# Patient Record
Sex: Female | Born: 1975 | Race: Black or African American | Hispanic: No | Marital: Married | Smoking: Never smoker
Health system: Southern US, Community
[De-identification: ages and names within clinical notes are randomized; demographics above are authoritative.]

## PROBLEM LIST (undated history)

## (undated) DIAGNOSIS — O209 Hemorrhage in early pregnancy, unspecified: Secondary | ICD-10-CM

## (undated) DIAGNOSIS — Z789 Other specified health status: Secondary | ICD-10-CM

## (undated) DIAGNOSIS — D649 Anemia, unspecified: Secondary | ICD-10-CM

## (undated) DIAGNOSIS — O3680X0 Pregnancy with inconclusive fetal viability, not applicable or unspecified: Secondary | ICD-10-CM

## (undated) HISTORY — PX: NO PAST SURGERIES: SHX2092

## (undated) HISTORY — DX: Anemia, unspecified: D64.9

---

## 2016-07-05 ENCOUNTER — Inpatient Hospital Stay (HOSPITAL_COMMUNITY)
Admission: AD | Admit: 2016-07-05 | Discharge: 2016-07-05 | Disposition: A | Discharge status: Home / Self Care | Payer: Medicaid Other | Source: Ambulatory Visit | Attending: Family Medicine | Admitting: Family Medicine

## 2016-07-05 ENCOUNTER — Inpatient Hospital Stay (HOSPITAL_COMMUNITY): Payer: Medicaid Other

## 2016-07-05 ENCOUNTER — Encounter (HOSPITAL_COMMUNITY): Payer: Self-pay | Admitting: *Deleted

## 2016-07-05 DIAGNOSIS — N939 Abnormal uterine and vaginal bleeding, unspecified: Secondary | ICD-10-CM | POA: Diagnosis present

## 2016-07-05 DIAGNOSIS — O208 Other hemorrhage in early pregnancy: Secondary | ICD-10-CM

## 2016-07-05 DIAGNOSIS — O209 Hemorrhage in early pregnancy, unspecified: Secondary | ICD-10-CM | POA: Diagnosis not present

## 2016-07-05 DIAGNOSIS — O3680X0 Pregnancy with inconclusive fetal viability, not applicable or unspecified: Secondary | ICD-10-CM

## 2016-07-05 DIAGNOSIS — O09511 Supervision of elderly primigravida, first trimester: Secondary | ICD-10-CM | POA: Insufficient documentation

## 2016-07-05 DIAGNOSIS — Z3A01 Less than 8 weeks gestation of pregnancy: Secondary | ICD-10-CM | POA: Diagnosis not present

## 2016-07-05 HISTORY — DX: Pregnancy with inconclusive fetal viability, not applicable or unspecified: O36.80X0

## 2016-07-05 HISTORY — DX: Hemorrhage in early pregnancy, unspecified: O20.9

## 2016-07-05 HISTORY — DX: Other specified health status: Z78.9

## 2016-07-05 LAB — WET PREP, GENITAL
CLUE CELLS WET PREP: NONE SEEN
Sperm: NONE SEEN
Trich, Wet Prep: NONE SEEN
YEAST WET PREP: NONE SEEN

## 2016-07-05 LAB — URINALYSIS, ROUTINE W REFLEX MICROSCOPIC
Bilirubin Urine: NEGATIVE
Glucose, UA: NEGATIVE mg/dL
Ketones, ur: NEGATIVE mg/dL
NITRITE: NEGATIVE
Protein, ur: NEGATIVE mg/dL
SPECIFIC GRAVITY, URINE: 1.019 (ref 1.005–1.030)
pH: 6 (ref 5.0–8.0)

## 2016-07-05 LAB — CBC
HCT: 38.2 % (ref 36.0–46.0)
HEMOGLOBIN: 13.5 g/dL (ref 12.0–15.0)
MCH: 31.5 pg (ref 26.0–34.0)
MCHC: 35.3 g/dL (ref 30.0–36.0)
MCV: 89.3 fL (ref 78.0–100.0)
Platelets: 301 10*3/uL (ref 150–400)
RBC: 4.28 MIL/uL (ref 3.87–5.11)
RDW: 13.4 % (ref 11.5–15.5)
WBC: 8.5 10*3/uL (ref 4.0–10.5)

## 2016-07-05 LAB — ABO/RH: ABO/RH(D): A POS

## 2016-07-05 LAB — POCT PREGNANCY, URINE: PREG TEST UR: POSITIVE — AB

## 2016-07-05 LAB — HCG, QUANTITATIVE, PREGNANCY: hCG, Beta Chain, Quant, S: 18 m[IU]/mL — ABNORMAL HIGH (ref <5)

## 2016-07-05 NOTE — MAU Note (Signed)
+  vaginal bleeding x 3 days; patient states has to wear a pad; light spotting light pink to brown in color  Denies any pain at this time  +HPT LMP 05/17/16

## 2016-07-05 NOTE — MAU Provider Note (Signed)
History     CSN: [id number]  Arrival date and time: 07/05/16 1112   First Provider Initiated Contact with Patient 07/05/16 1319      Chief Complaint  Patient presents with  . Vaginal Bleeding  . Possible Pregnancy   [name] is a 41 yo G1P0 at 5.[redacted] wks gestation by LMP presenting today with complaints of light vaginal spotting that was pink to brown and gradually increased to "much more" over the past 3 days.  She states the bleeding is "okay today"; reporting there is no bleeding today. She denies pain.    Vaginal Bleeding  The patient's primary symptoms include vaginal bleeding (began with spotting, states increased bleeding over the past 3 days). The problem has been gradually worsening. The patient is experiencing no pain. She is pregnant.  Possible Pregnancy  This is a new (positive home pregnacy test) problem. The current episode started in the past 7 days.     Past Medical History:  Diagnosis Date  . Medical history non-contributory     Past Surgical History:  Procedure Laterality Date  . NO PAST SURGERIES      History reviewed. No pertinent family history.  Social History  Substance Use Topics  . Smoking status: Never Smoker  . Smokeless tobacco: Never Used  . Alcohol use No    Allergies: No Known Allergies  Prescriptions Prior to Admission  Medication Sig Dispense Refill Last Dose  . Prenatal Vit-Fe Fumarate-FA (PRENATAL MULTIVITAMIN) TABS tablet Take 1 tablet by mouth daily at 12 noon.   07/05/2016 at Unknown time    Review of Systems  Genitourinary: Positive for vaginal bleeding.   Physical Exam   Blood pressure 136/79, pulse 76, temperature 97.4 F (36.3 C), temperature source Oral, resp. rate 16, weight 78.9 kg (174 lb), last menstrual period 05/17/2016, SpO2 99 %.  Physical Exam  Constitutional: She appears well-developed and well-nourished.  Cardiovascular: Normal rate, regular rhythm and normal heart sounds.   Respiratory: Effort  normal and breath sounds normal.  GI: Soft. Bowel sounds are normal.  Genitourinary: Cervix exhibits no motion tenderness and no friability. Right adnexum displays no mass and no tenderness. Left adnexum displays no mass and no tenderness. There is bleeding in the vagina.  Genitourinary Comments: Vaginal bleeding   Skin: Skin is warm and dry.   Results for orders placed or performed during the hospital encounter of 07/05/16 (from the past 24 hour(s))  Urinalysis, Routine w reflex microscopic     Status: Abnormal   Collection Time: 07/05/16 11:37 AM  Result Value Ref Range   Color, Urine YELLOW YELLOW   APPearance HAZY (A) CLEAR   Specific Gravity, Urine 1.019 1.005 - 1.030   pH 6.0 5.0 - 8.0   Glucose, UA NEGATIVE NEGATIVE mg/dL   Hgb urine dipstick LARGE (A) NEGATIVE   Bilirubin Urine NEGATIVE NEGATIVE   Ketones, ur NEGATIVE NEGATIVE mg/dL   Protein, ur NEGATIVE NEGATIVE mg/dL   Nitrite NEGATIVE NEGATIVE   Leukocytes, UA LARGE (A) NEGATIVE   RBC / HPF TOO NUMEROUS TO COUNT 0 - 5 RBC/hpf   WBC, UA TOO NUMEROUS TO COUNT 0 - 5 WBC/hpf   Bacteria, UA RARE (A) NONE SEEN   Squamous Epithelial / LPF 6-30 (A) NONE SEEN   Mucous PRESENT   Pregnancy, urine POC     Status: Abnormal   Collection Time: 07/05/16 11:48 AM  Result Value Ref Range   Preg Test, Ur POSITIVE (A) NEGATIVE  Wet prep, genital  Status: Abnormal   Collection Time: 07/05/16  1:25 PM  Result Value Ref Range   Yeast Wet Prep HPF POC NONE SEEN NONE SEEN   Trich, Wet Prep NONE SEEN NONE SEEN   Clue Cells Wet Prep HPF POC NONE SEEN NONE SEEN   WBC, Wet Prep HPF POC MANY (A) NONE SEEN   Sperm NONE SEEN   CBC     Status: None   Collection Time: 07/05/16  1:33 PM  Result Value Ref Range   WBC 8.5 4.0 - 10.5 K/uL   RBC 4.28 3.87 - 5.11 MIL/uL   Hemoglobin 13.5 12.0 - 15.0 g/dL   HCT 38.2 36.0 - 46.0 %   MCV 89.3 78.0 - 100.0 fL   MCH 31.5 26.0 - 34.0 pg   MCHC 35.3 30.0 - 36.0 g/dL   RDW 13.4 11.5 - 15.5 %    Platelets 301 150 - 400 K/uL  hCG, quantitative, pregnancy     Status: Abnormal   Collection Time: 07/05/16  1:33 PM  Result Value Ref Range   hCG, Beta Chain, Quant, S 18 (H) <5 mIU/mL  ABO/Rh     Status: None (Preliminary result)   Collection Time: 07/05/16  1:33 PM  Result Value Ref Range   ABO/RH(D) A POS    MAU Course  Procedures  MDM CBC OB U/S <14 wks  OB U/S Transvaginal  HCG ABO/Rh GC/CT Wet Prep HIV    Assessment and Plan  Vaginal bleeding in pregnancy, first trimester Instructed her to return to clinic for lab work on Thursday at 11 am and to return to MAU if vaginal bleeding returns.  Pregnancy of unknown anatomic location Instructed her to return to the clinic for lab work on Thursday at 64 am and educated her on the importance of being on time.  Explained to her to return to MAU if vaginal bleeding returns, if she sees any vaginal discharge, or if she begins to experience any abdominal pain.    Pasty Spillers, PA-S 07/05/2016 1:34 PM  I confirm that I have verified the information documented in the physician assistant student's note and that I have also personally reperformed the physical exam and all medical decision making activities.  Laury Deep MSN, CNM 07/05/2016, 1:34 PM

## 2016-07-06 LAB — HIV ANTIBODY (ROUTINE TESTING W REFLEX): HIV Screen 4th Generation wRfx: NONREACTIVE

## 2016-07-06 LAB — GC/CHLAMYDIA PROBE AMP (~~LOC~~) NOT AT ARMC
CHLAMYDIA, DNA PROBE: NEGATIVE
Neisseria Gonorrhea: NEGATIVE

## 2016-07-07 ENCOUNTER — Ambulatory Visit: Payer: Self-pay | Admitting: General Practice

## 2016-07-07 DIAGNOSIS — O3680X0 Pregnancy with inconclusive fetal viability, not applicable or unspecified: Secondary | ICD-10-CM

## 2016-07-07 LAB — HCG, QUANTITATIVE, PREGNANCY: HCG, BETA CHAIN, QUANT, S: 11 m[IU]/mL — AB (ref <5)

## 2016-07-07 NOTE — Progress Notes (Signed)
Patient here for stat bhcg today. Patient denies bleeding or pain but would like to wait for results. Spoke to Dr Baron Sane regarding bhcg results who states levels are consistent with SAB. Patient should follow up in 1 week for repeat bhcg & follow up in 2 weeks with a provider. Informed patient of results & recommended follow up.  Patient verbalized understanding & had no questions

## 2016-07-14 ENCOUNTER — Other Ambulatory Visit: Payer: Self-pay

## 2016-07-14 DIAGNOSIS — O3680X0 Pregnancy with inconclusive fetal viability, not applicable or unspecified: Secondary | ICD-10-CM

## 2016-07-15 ENCOUNTER — Telehealth: Payer: Self-pay | Admitting: General Practice

## 2016-07-15 LAB — BETA HCG QUANT (REF LAB): hCG Quant: 6 m[IU]/mL

## 2016-07-15 NOTE — Telephone Encounter (Signed)
Called patient, no answer- left message stating we are trying to reach you to inform you your recent lab work was normal. If you have any questions you may call us back.

## 2016-07-15 NOTE — Telephone Encounter (Signed)
-----   Message from Truett Mainland, DO sent at 07/15/2016  9:08 AM EDT ----- No further quants needed.

## 2016-07-18 ENCOUNTER — Ambulatory Visit (INDEPENDENT_AMBULATORY_CARE_PROVIDER_SITE_OTHER): Payer: BLUE CROSS/BLUE SHIELD | Admitting: Advanced Practice Midwife

## 2016-07-18 ENCOUNTER — Encounter: Payer: Self-pay | Admitting: Advanced Practice Midwife

## 2016-07-18 VITALS — BP 132/85 | HR 108 | Wt 179.0 lb

## 2016-07-18 DIAGNOSIS — O039 Complete or unspecified spontaneous abortion without complication: Secondary | ICD-10-CM | POA: Diagnosis not present

## 2016-07-18 NOTE — Progress Notes (Signed)
Subjective:     Patient ID: Martha Valencia, female   DOB: [birth year], 41 y.o.   MRN: [id number]  HPI: Here for F/U after SAB. Was seen in MAU. Bled for two days. No pain of bleeding now. Husband in still in [place]. Did not get Visa. Pt declines birth control.  Quants: 5/29: 18 5/31: 11  6/7: 6  Last Pap in [place] 6 months ago Nml per pt.   Review of Systems  Constitutional: Negative for chills and fever.  Gastrointestinal: Negative for abdominal pain.  Genitourinary: Negative for vaginal bleeding and vaginal discharge.  Neurological: Negative for dizziness.  Psychiatric/Behavioral: Negative for dysphoric mood.       Objective:BP 132/85   Pulse (!) 108   Wt 179 lb (81.2 kg)     Physical Exam  Constitutional: She is oriented to person, place, and time. She appears well-developed and well-nourished. No distress.  Cardiovascular: Normal rate.   Pulmonary/Chest: Effort normal. No respiratory distress.  Abdominal: There is no tenderness.  Genitourinary:  Genitourinary Comments: declined  Neurological: She is alert and oriented to person, place, and time.  Skin: Skin is warm and dry.  Psychiatric: She has a normal mood and affect.  Nursing note and vitals reviewed.      Assessment:     1. Complete miscarriage    Plan:     F/U PRN Support given.  Offered visit w/ Roselyn Reef, declines.  Recommend Annual Gyn visits Pap due [place], [place], [place] 07/18/2016 3:03 PM

## 2016-07-18 NOTE — Patient Instructions (Signed)
Preparing for Pregnancy If you are considering becoming pregnant, make an appointment to see your regular health care provider to learn how to prepare for a safe and healthy pregnancy (preconception care). During a preconception care visit, your health care provider will:  Do a complete physical exam, including a Pap test.  Take a complete medical history.  Give you information, answer your questions, and help you resolve problems.  Preconception checklist Medical history  Tell your health care provider about any current or past medical conditions. Your pregnancy or your ability to become pregnant may be affected by chronic conditions, such as diabetes, chronic hypertension, and thyroid problems.  Include your family's medical history as well as your partner's medical history.  Tell your health care provider about any history of STIs (sexually transmitted infections).These can affect your pregnancy. In some cases, they can be passed to your baby. Discuss any concerns that you have about STIs.  If indicated, discuss the benefits of genetic testing. This testing will show whether there are any genetic conditions that may be passed from you or your partner to your baby.  Tell your health care provider about: ? Any problems you have had with conception or pregnancy. ? Any medicines you take. These include vitamins, herbal supplements, and over-the-counter medicines. ? Your history of immunizations. Discuss any vaccinations that you may need.  Diet  Ask your health care provider what to include in a healthy diet that has a balance of nutrients. This is especially important when you are pregnant or preparing to become pregnant.  Ask your health care provider to help you reach a healthy weight before pregnancy. ? If you are overweight, you may be at higher risk for certain complications, such as high blood pressure, diabetes, and preterm birth. ? If you are underweight, you are more likely  to have a baby who has a low birth weight.  Lifestyle, work, and home  Let your health care provider know: ? About any lifestyle habits that you have, such as alcohol use, drug use, or smoking. ? About recreational activities that may put you at risk during pregnancy, such as downhill skiing and certain exercise programs. ? Tell your health care provider about any international travel, especially any travel to places with an active Zika virus outbreak. ? About harmful substances that you may be exposed to at work or at home. These include chemicals, pesticides, radiation, or even litter boxes. ? If you do not feel safe at home.  Mental health  Tell your health care provider about: ? Any history of mental health conditions, including feelings of depression, sadness, or anxiety. ? Any medicines that you take for a mental health condition. These include herbs and supplements.  Home instructions to prepare for pregnancy Lifestyle  Eat a balanced diet. This includes fresh fruits and vegetables, whole grains, lean meats, low-fat dairy products, healthy fats, and foods that are high in fiber. Ask to meet with a nutritionist or registered dietitian for assistance with meal planning and goals.  Get regular exercise. Try to be active for at least 30 minutes a day on most days of the week. Ask your health care provider which activities are safe during pregnancy.  Do not use any products that contain nicotine or tobacco, such as cigarettes and e-cigarettes. If you need help quitting, ask your health care provider.  Do not drink alcohol.  Do not take illegal drugs.  Maintain a healthy weight. Ask your health care provider what weight range is   right for you.  General instructions  Keep an accurate record of your menstrual periods. This makes it easier for your health care provider to determine your baby's due date.  Begin taking prenatal vitamins and folic acid supplements daily as directed by  your health care provider.  Manage any chronic conditions, such as high blood pressure and diabetes, as told by your health care provider. This is important.  How do I know that I am pregnant? You may be pregnant if you have been sexually active and you miss your period. Symptoms of early pregnancy include:  Mild cramping.  Very light vaginal bleeding (spotting).  Feeling unusually tired.  Nausea and vomiting (morning sickness).  If you have any of these symptoms and you suspect that you might be pregnant, you can take a home pregnancy test. These tests check for a hormone in your urine (human chorionic gonadotropin, or hCG). A woman's body begins to make this hormone during early pregnancy. These tests are very accurate. Wait until at least the first day after you miss your period to take one. If the test shows that you are pregnant (you get a positive result), call your health care provider to make an appointment for prenatal care. What should I do if I become pregnant?  Make an appointment with your health care provider as soon as you suspect you are pregnant.  Do not use any products that contain nicotine, such as cigarettes, chewing tobacco, and e-cigarettes. If you need help quitting, ask your health care provider.  Do not drink alcoholic beverages. Alcohol is related to a number of birth defects.  Avoid toxic odors and chemicals.  You may continue to have sexual intercourse if it does not cause pain or other problems, such as vaginal bleeding. This information is not intended to replace advice given to you by your health care provider. Make sure you discuss any questions you have with your health care provider. Document Released: 01/07/2008 Document Revised: 09/22/2015 Document Reviewed: 08/16/2015 Elsevier Interactive Patient Education  2017 [employer]  

## 2019-02-28 IMAGING — US US OB TRANSVAGINAL
1 series · 15 of 28 positions shown · non-contrast
Comparison: None

CLINICAL DATA: Vaginal bleeding affecting early pregnancy,
quantitative beta HCG = 18, EGA 5 weeks 4 days by LMP

EXAM:
OBSTETRIC <14 WK US AND TRANSVAGINAL OB US
TECHNIQUE: Both transabdominal and transvaginal ultrasound examinations were
performed for complete evaluation of the gestation as well as the
maternal uterus, adnexal regions, and pelvic cul-de-sac.
Transvaginal technique was performed to assess early pregnancy.

[Series 1: us ob transvaginal · 53 acquisitions, 15 frames shown]
[im 1/53]
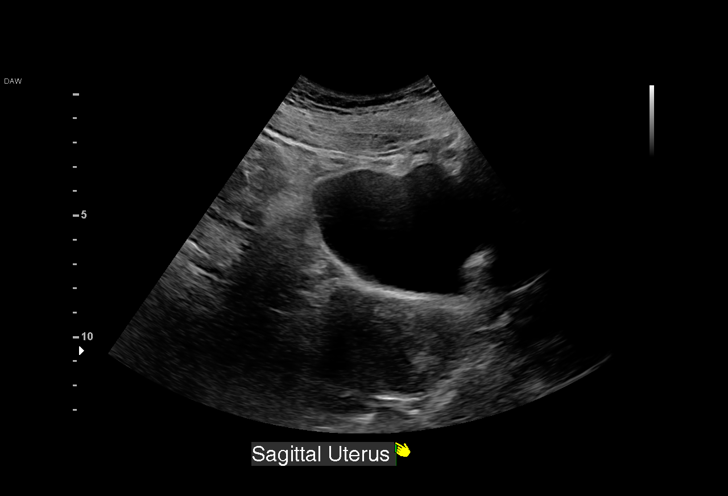
[im 4/53]
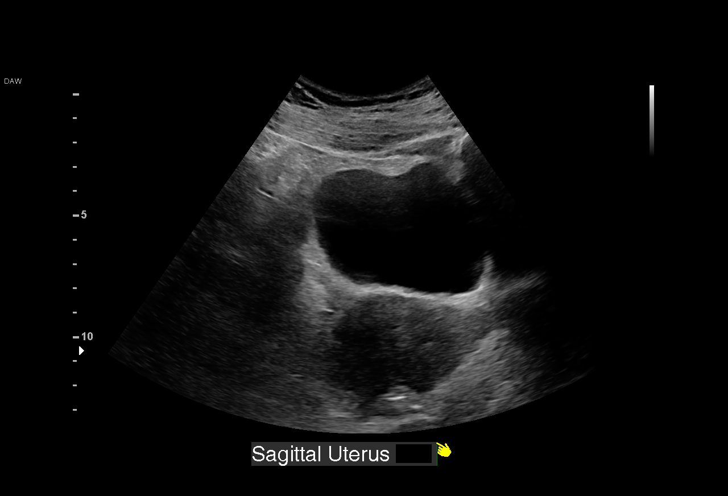
[im 8/53]
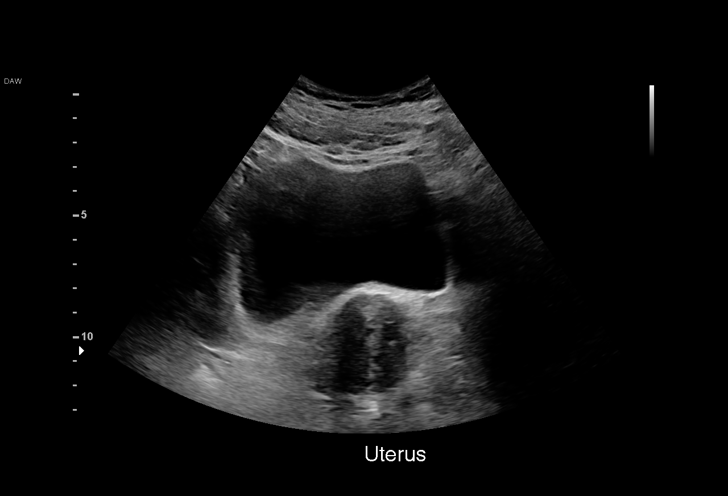
[im 12/53]
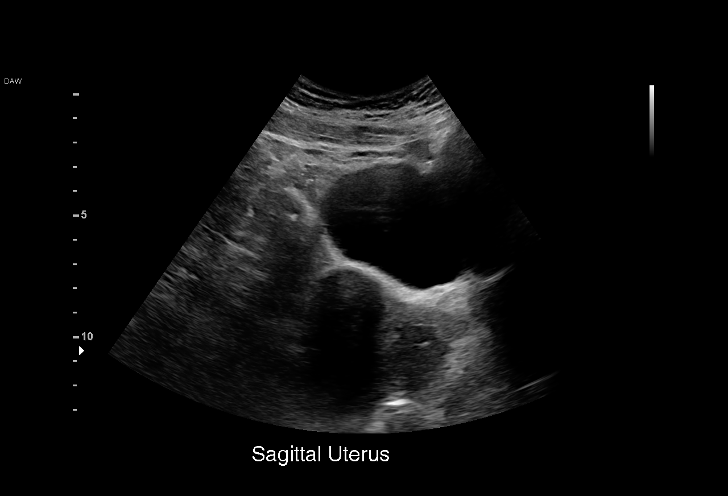
[im 16/53]
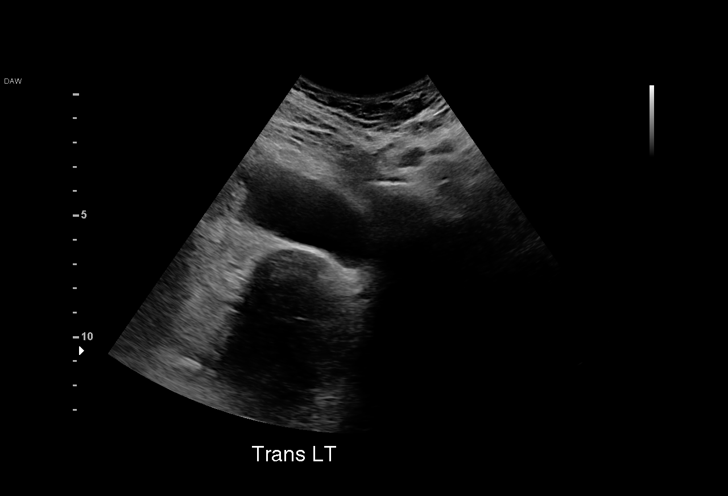
[im 20/53]
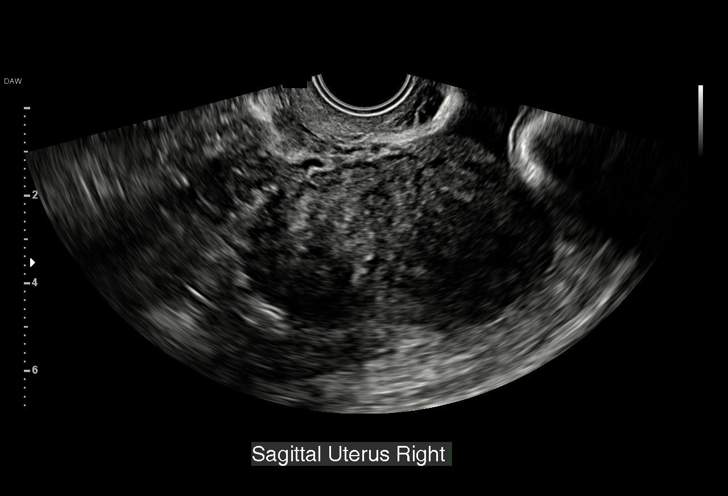
[im 24/53]
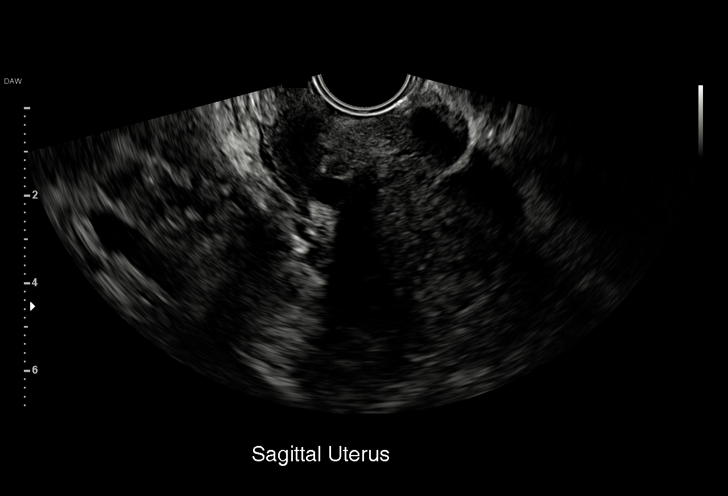
[im 27/53]
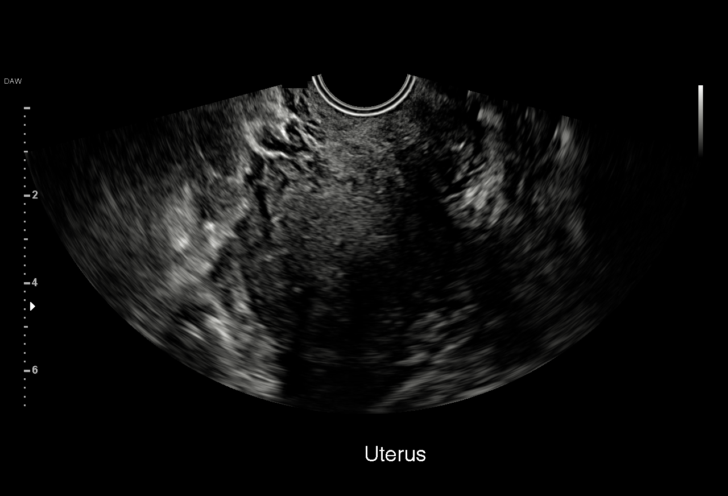
[im 29/53]
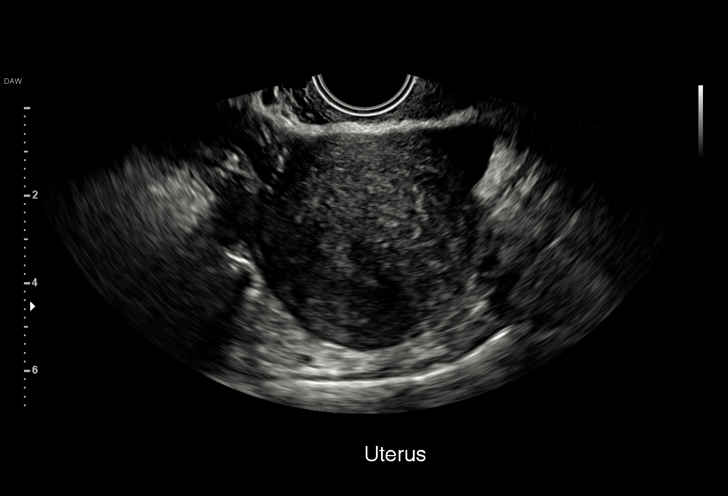
[im 33/53]
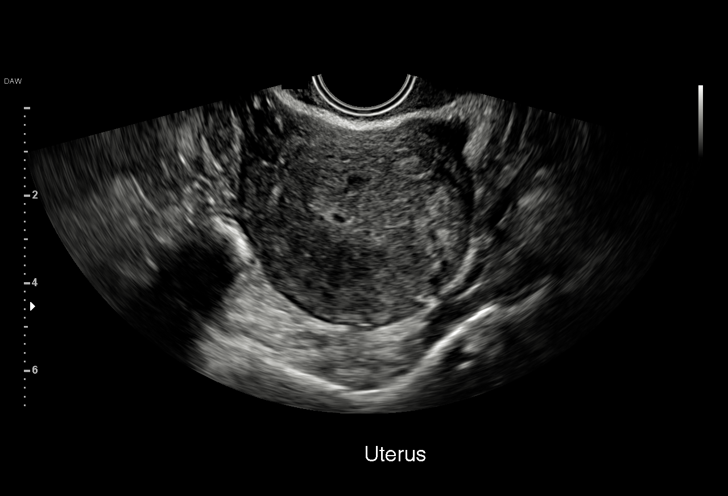
[im 37/53]
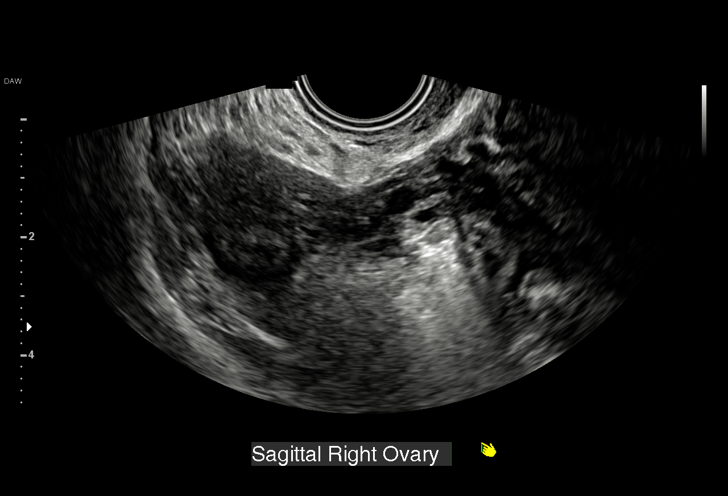
[im 41/53]
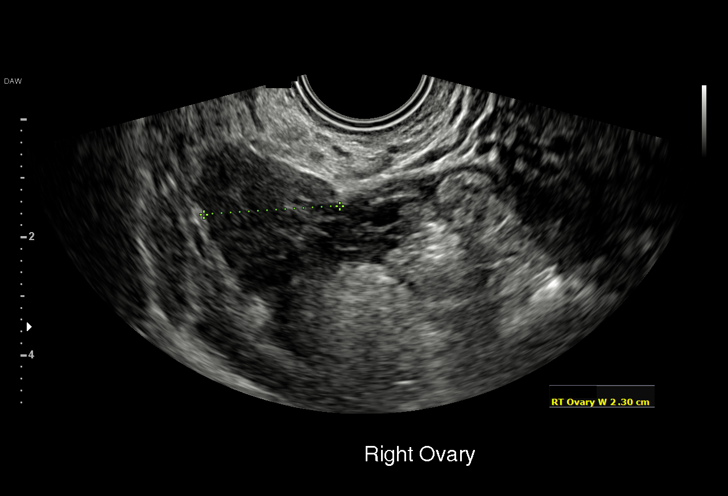
[im 45/53]
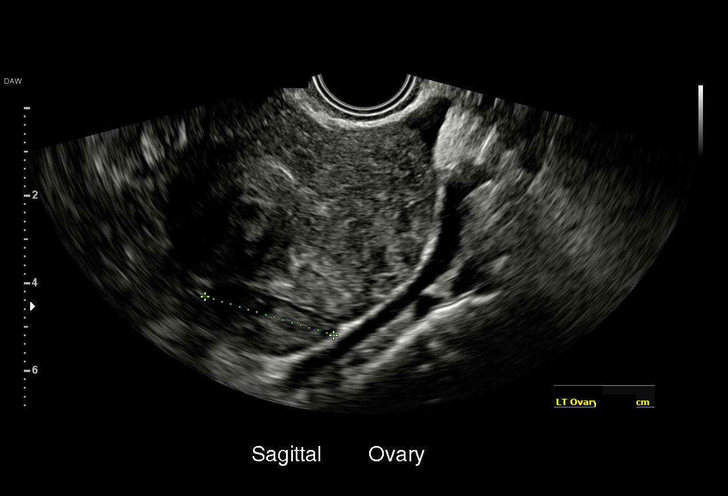
[im 49/53]
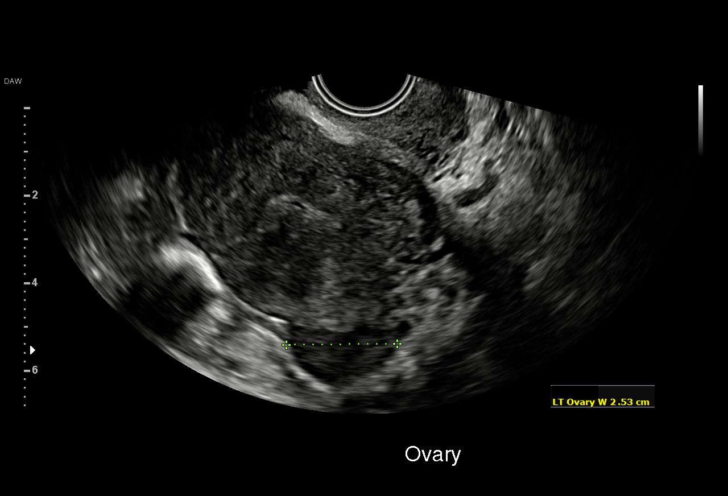
[im 53/53]
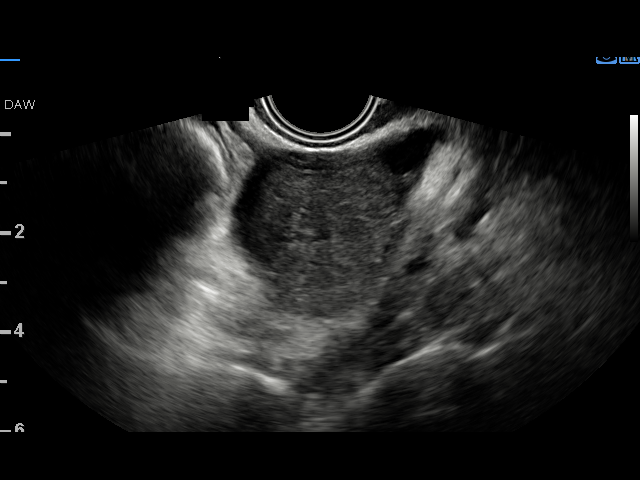

[15 of 28 positions shown; findings below may reference images not displayed]

FINDINGS: Intrauterine gestational sac: Not identified

Yolk sac:  N/A

Embryo:  N/A

Cardiac Activity: N/A

Heart Rate: N/A  bpm

Subchorionic hemorrhage:  N/A

Maternal uterus/adnexae:

No intrauterine gestational sac identified.

Uterus normal morphology with a small probable uterine leiomyoma
x 2.6 x 2.8 cm.

RIGHT ovary normal size and morphology 2.7 x 2.2 x 2.3 cm

LEFT ovary normal size and morphology 3.1 x 1.3 x 2.5 cm

No adnexal masses.

Trace free pelvic fluid.
IMPRESSION: Small probable uterine leiomyoma 2.8 cm.

No intrauterine gestation identified.

Findings are compatible with pregnancy of unknown location.

Differential diagnosis includes early intrauterine pregnancy too
early to visualize, spontaneous abortion, and ectopic pregnancy.

Serial quantitative beta HCG and or followup ultrasound recommended
to definitively exclude ectopic pregnancy.

## 2021-07-31 ENCOUNTER — Encounter (HOSPITAL_COMMUNITY): Payer: Self-pay

## 2021-07-31 ENCOUNTER — Emergency Department (HOSPITAL_COMMUNITY): Payer: Self-pay

## 2021-07-31 ENCOUNTER — Emergency Department (HOSPITAL_COMMUNITY)
Admission: EM | Admit: 2021-07-31 | Discharge: 2021-07-31 | Disposition: A | Discharge status: Home / Self Care | Payer: Self-pay | Attending: Emergency Medicine | Admitting: Emergency Medicine

## 2021-07-31 ENCOUNTER — Other Ambulatory Visit: Payer: Self-pay

## 2021-07-31 DIAGNOSIS — N9489 Other specified conditions associated with female genital organs and menstrual cycle: Secondary | ICD-10-CM | POA: Insufficient documentation

## 2021-07-31 DIAGNOSIS — D509 Iron deficiency anemia, unspecified: Secondary | ICD-10-CM | POA: Insufficient documentation

## 2021-07-31 DIAGNOSIS — R109 Unspecified abdominal pain: Secondary | ICD-10-CM

## 2021-07-31 DIAGNOSIS — N3001 Acute cystitis with hematuria: Secondary | ICD-10-CM | POA: Insufficient documentation

## 2021-07-31 LAB — URINALYSIS, ROUTINE W REFLEX MICROSCOPIC
Bilirubin Urine: NEGATIVE
Glucose, UA: NEGATIVE mg/dL
Ketones, ur: NEGATIVE mg/dL
Nitrite: NEGATIVE
Protein, ur: NEGATIVE mg/dL
Specific Gravity, Urine: 1.002 — ABNORMAL LOW (ref 1.005–1.030)
pH: 7 (ref 5.0–8.0)

## 2021-07-31 LAB — BASIC METABOLIC PANEL
Anion gap: 7 (ref 5–15)
BUN: 6 mg/dL (ref 6–20)
CO2: 22 mmol/L (ref 22–32)
Calcium: 8.8 mg/dL — ABNORMAL LOW (ref 8.9–10.3)
Chloride: 108 mmol/L (ref 98–111)
Creatinine, Ser: 0.53 mg/dL (ref 0.44–1.00)
GFR, Estimated: >60 mL/min (ref >60)
Glucose, Bld: 103 mg/dL — ABNORMAL HIGH (ref 70–99)
Potassium: 3.6 mmol/L (ref 3.5–5.1)
Sodium: 137 mmol/L (ref 135–145)

## 2021-07-31 LAB — CBC
HCT: 28.7 % — ABNORMAL LOW (ref 36.0–46.0)
Hemoglobin: 8.5 g/dL — ABNORMAL LOW (ref 12.0–15.0)
MCH: 20.2 pg — ABNORMAL LOW (ref 26.0–34.0)
MCHC: 29.6 g/dL — ABNORMAL LOW (ref 30.0–36.0)
MCV: 68.2 fL — ABNORMAL LOW (ref 80.0–100.0)
Platelets: 396 10*3/uL (ref 150–400)
RBC: 4.21 MIL/uL (ref 3.87–5.11)
RDW: 18.7 % — ABNORMAL HIGH (ref 11.5–15.5)
WBC: 7.7 10*3/uL (ref 4.0–10.5)
nRBC: 0 % (ref 0.0–0.2)

## 2021-07-31 LAB — HEPATIC FUNCTION PANEL
ALT: 13 U/L (ref 0–44)
AST: 14 U/L — ABNORMAL LOW (ref 15–41)
Albumin: 3.7 g/dL (ref 3.5–5.0)
Alkaline Phosphatase: 51 U/L (ref 38–126)
Bilirubin, Direct: <0.1 mg/dL (ref 0.0–0.2)
Total Bilirubin: 0.2 mg/dL — ABNORMAL LOW (ref 0.3–1.2)
Total Protein: 7.1 g/dL (ref 6.5–8.1)

## 2021-07-31 LAB — I-STAT BETA HCG BLOOD, ED (MC, WL, AP ONLY): I-stat hCG, quantitative: <5 m[IU]/mL (ref <5)

## 2021-07-31 LAB — LIPASE, BLOOD: Lipase: 29 U/L (ref 11–51)

## 2021-07-31 LAB — TROPONIN I (HIGH SENSITIVITY): Troponin I (High Sensitivity): <2 ng/L (ref <18)

## 2021-07-31 MED ORDER — CEPHALEXIN 500 MG PO CAPS: 500.0000 mg | ORAL_CAPSULE | Freq: Two times a day (BID) | ORAL | 0 refills | Status: DC

## 2021-07-31 MED ORDER — MORPHINE SULFATE (PF) 4 MG/ML IV SOLN
4.0000 mg | Freq: Once | INTRAVENOUS | Status: AC
  Administered 2021-07-31: 4 mg via INTRAVENOUS
  Filled 2021-07-31: qty 1

## 2021-07-31 MED ORDER — FERROUS SULFATE 325 (65 FE) MG PO TABS: 325.0000 mg | ORAL_TABLET | Freq: Every day | ORAL | 0 refills | Status: DC

## 2021-07-31 MED ORDER — ONDANSETRON HCL 4 MG/2ML IJ SOLN
4.0000 mg | Freq: Once | INTRAMUSCULAR | Status: AC
  Administered 2021-07-31: 4 mg via INTRAVENOUS
  Filled 2021-07-31: qty 2

## 2021-08-02 ENCOUNTER — Emergency Department (HOSPITAL_COMMUNITY)
Admission: EM | Admit: 2021-08-02 | Discharge: 2021-08-02 | Disposition: A | Discharge status: Home / Self Care | Payer: Self-pay | Attending: Emergency Medicine | Admitting: Emergency Medicine

## 2021-08-02 ENCOUNTER — Emergency Department (HOSPITAL_COMMUNITY): Payer: Self-pay

## 2021-08-02 ENCOUNTER — Ambulatory Visit: Payer: Self-pay

## 2021-08-02 ENCOUNTER — Other Ambulatory Visit: Payer: Self-pay

## 2021-08-02 DIAGNOSIS — M5127 Other intervertebral disc displacement, lumbosacral region: Secondary | ICD-10-CM | POA: Insufficient documentation

## 2021-08-02 DIAGNOSIS — D259 Leiomyoma of uterus, unspecified: Secondary | ICD-10-CM | POA: Insufficient documentation

## 2021-08-02 DIAGNOSIS — N12 Tubulo-interstitial nephritis, not specified as acute or chronic: Secondary | ICD-10-CM

## 2021-08-02 LAB — URINALYSIS, ROUTINE W REFLEX MICROSCOPIC
Bilirubin Urine: NEGATIVE
Glucose, UA: NEGATIVE mg/dL
Ketones, ur: NEGATIVE mg/dL
Nitrite: NEGATIVE
Protein, ur: NEGATIVE mg/dL
Specific Gravity, Urine: 1.003 — ABNORMAL LOW (ref 1.005–1.030)
WBC, UA: >50 WBC/hpf — ABNORMAL HIGH (ref 0–5)
pH: 6 (ref 5.0–8.0)

## 2021-08-02 LAB — CBC
HCT: 32.1 % — ABNORMAL LOW (ref 36.0–46.0)
Hemoglobin: 8.9 g/dL — ABNORMAL LOW (ref 12.0–15.0)
MCH: 19 pg — ABNORMAL LOW (ref 26.0–34.0)
MCHC: 27.7 g/dL — ABNORMAL LOW (ref 30.0–36.0)
MCV: 68.4 fL — ABNORMAL LOW (ref 80.0–100.0)
Platelets: 424 10*3/uL — ABNORMAL HIGH (ref 150–400)
RBC: 4.69 MIL/uL (ref 3.87–5.11)
RDW: 19.1 % — ABNORMAL HIGH (ref 11.5–15.5)
WBC: 7.4 10*3/uL (ref 4.0–10.5)
nRBC: 0 % (ref 0.0–0.2)

## 2021-08-02 LAB — BASIC METABOLIC PANEL
Anion gap: 8 (ref 5–15)
BUN: 5 mg/dL — ABNORMAL LOW (ref 6–20)
CO2: 20 mmol/L — ABNORMAL LOW (ref 22–32)
Calcium: 9.3 mg/dL (ref 8.9–10.3)
Chloride: 110 mmol/L (ref 98–111)
Creatinine, Ser: 0.5 mg/dL (ref 0.44–1.00)
GFR, Estimated: >60 mL/min (ref >60)
Glucose, Bld: 81 mg/dL (ref 70–99)
Potassium: 3.6 mmol/L (ref 3.5–5.1)
Sodium: 138 mmol/L (ref 135–145)

## 2021-08-02 LAB — I-STAT BETA HCG BLOOD, ED (MC, WL, AP ONLY): I-stat hCG, quantitative: <5 m[IU]/mL (ref <5)

## 2021-08-02 MED ORDER — LORAZEPAM 2 MG/ML IJ SOLN
0.5000 mg | Freq: Once | INTRAMUSCULAR | Status: AC
  Administered 2021-08-02: 0.5 mg via INTRAVENOUS
  Filled 2021-08-02: qty 1

## 2021-08-02 MED ORDER — SODIUM CHLORIDE 0.9 % IV SOLN
1.0000 g | INTRAVENOUS | Status: DC
  Administered 2021-08-02: 1 g via INTRAVENOUS
  Filled 2021-08-02: qty 10

## 2021-08-02 MED ORDER — CEFDINIR 300 MG PO CAPS: 300.0000 mg | ORAL_CAPSULE | Freq: Two times a day (BID) | ORAL | 0 refills | Status: DC

## 2021-08-02 MED ORDER — MORPHINE SULFATE (PF) 4 MG/ML IV SOLN
6.0000 mg | Freq: Once | INTRAVENOUS | Status: AC
  Administered 2021-08-02: 6 mg via INTRAVENOUS
  Filled 2021-08-02: qty 2

## 2021-08-02 MED ORDER — OXYCODONE-ACETAMINOPHEN 5-325 MG PO TABS: 1.0000 | ORAL_TABLET | ORAL | 0 refills | Status: DC | PRN

## 2021-08-02 MED ORDER — LACTATED RINGERS IV SOLN: INTRAVENOUS | Status: DC

## 2021-08-02 MED ORDER — KETOROLAC TROMETHAMINE 15 MG/ML IJ SOLN
15.0000 mg | Freq: Once | INTRAMUSCULAR | Status: AC
  Administered 2021-08-02: 15 mg via INTRAVENOUS
  Filled 2021-08-02: qty 1

## 2021-08-04 LAB — URINE CULTURE: Culture: 20000 — AB

## 2021-08-05 ENCOUNTER — Telehealth: Payer: Self-pay

## 2021-08-05 NOTE — Telephone Encounter (Signed)
Post ED Visit - Positive Culture Follow-up  Culture report reviewed by antimicrobial stewardship pharmacist: [place] Team '[x]'$  Luisa Hart, [place].D. '[]'$  Heide Guile, Pharm.D., BCPS AQ-ID '[]'$  Parks Neptune, Pharm.D., BCPS '[]'$  Alycia Rossetti, Pharm.D., BCPS '[]'$  [place], Pharm.D., BCPS, AAHIVP '[]'$  Legrand Como, Pharm.D., BCPS, AAHIVP '[]'$  Salome Arnt, PharmD, BCPS '[]'$  Johnnette Gourd, PharmD, BCPS '[]'$  Hughes Better, PharmD, BCPS '[]'$  Leeroy Cha, PharmD '[]'$  Laqueta Linden, PharmD, BCPS '[]'$  Albertina Parr, PharmD  [place] Team '[]'$  Leodis Sias, PharmD '[]'$  Lindell Spar, PharmD '[]'$  Royetta Asal, PharmD '[]'$  Graylin Shiver, Rph '[]'$  Rema Fendt) Glennon Mac, PharmD '[]'$  Arlyn Dunning, PharmD '[]'$  Netta Cedars, PharmD '[]'$  Dia Sitter, PharmD '[]'$  Leone Haven, PharmD '[]'$  Gretta Arab, PharmD '[]'$  Theodis Shove, PharmD '[]'$  Peggyann Juba, PharmD '[]'$  Reuel Boom, PharmD   Positive urine culture Treated with Cefdinir, organism sensitive to the same and no further patient follow-up is required at this time.  Glennon Hamilton 08/05/2021, 6:23 PM

## 2021-08-05 NOTE — Progress Notes (Signed)
ED Antimicrobial Stewardship Positive Culture Follow Up   Martha Valencia is an 46 y.o. female who presented to [facility] on 08/02/2021 with a chief complaint of  Chief Complaint  Patient presents with   Flank Pain    Recent Results (from the past 720 hour(s))  Urine Culture     Status: Abnormal   Collection Time: 08/02/21 11:05 AM   Specimen: Urine, Clean Catch  Result Value Ref Range Status   Specimen Description URINE, CLEAN CATCH  Final   Special Requests   Final    NONE Performed at [facility], [address] [place], [place] [address]    Culture 20,000 COLONIES/mL YEAST (A)  Final   Report Status 08/04/2021 FINAL  Final    '[x]'$  Treated with cefdinir, which does not cover yeast growing in urine culture.   Culture and clinical course reviewed with provider and no changes to antibiotics needed. Continue current prescription for cefdinir to treat pyelonephritis. No antifungal coverage indicated.   ED Provider: Dr. Davonna Belling   Kaleen Mask 08/05/2021, 9:12 AM Clinical Pharmacist Monday - Friday phone -  [phone] Saturday - Sunday phone - [phone]

## 2021-08-09 NOTE — Progress Notes (Unsigned)
New Patient Office Visit  Subjective    Patient ID: Martha Valencia, female    DOB: 09-21-1975  Age: 46 y.o. MRN: [id number]  CC:  Chief Complaint  Patient presents with   Establish Care    New pt. No questions/ concerns.    HPI Martha Valencia presents for new patient visit to establish care.  Introduced to Designer, jewellery role and practice setting.  All questions answered.  Discussed provider/patient relationship and expectations.  She states that she had 2 weeks ago she went to the ER for back pain and was diagnosed with pyelonephritis.  She was given antibiotics and completed the course.  She states that her symptoms have resolved.  She was also noted at the ER visit to have low iron counts, her hemoglobin was 8.9.  She states that she does have heavy menstrual periods that time, that typically last about 7 days.  Her cycle has been varying in length recently from 24 to 48 days.  She started an iron supplement and taking a prenatal vitamin daily.  She was having some fatigue, however this has improved.  She denies shortness of breath, chest pain, blood in her stool, abnormal bleeding.  She has had dry mouth for several years. No matter how much water she drinks, she still feels a dry mouth. She statses she sometimes wakes up at night because of this.  She 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  Depression screen done:     08/12/2021   10:38 AM  Depression screen PHQ 2/9  Decreased Interest 0  Down, Depressed, Hopeless 0  PHQ - 2 Score 0    Outpatient Encounter Medications as of 08/12/2021  Medication Sig   ferrous sulfate 325 (65 FE) MG tablet Take 1 tablet (325 mg total) by mouth daily.   Prenatal Vit-Fe Fumarate-FA (PRENATAL MULTIVITAMIN) TABS tablet Take 1 tablet by mouth daily at 12 noon.   [DISCONTINUED] cefdinir (OMNICEF) 300 MG capsule Take 1 capsule (300 mg total) by mouth 2 (two) times daily. (Patient not taking: Reported on 08/12/2021)   [DISCONTINUED] oxyCODONE-acetaminophen  (PERCOCET/ROXICET) 5-325 MG tablet Take 1 tablet by mouth every 4 (four) hours as needed for severe pain. (Patient not taking: Reported on 08/12/2021)   No facility-administered encounter medications on file as of 08/12/2021.    Past Medical History:  Diagnosis Date   Anemia    Pregnancy of unknown anatomic location 07/05/2016   Vaginal bleeding in pregnancy, first trimester 07/05/2016    Past Surgical History:  Procedure Laterality Date   NO PAST SURGERIES      History reviewed. No pertinent family history.  Social History   Socioeconomic History   Marital status: Married    Spouse name: Not on file   Number of children: Not on file   Years of education: Not on file   Highest education level: Not on file  Occupational History   Not on file  Tobacco Use   Smoking status: Never   Smokeless tobacco: Never  Vaping Use   Vaping Use: Never used  Substance and Sexual Activity   Alcohol use: No   Drug use: No   Sexual activity: Yes    Birth control/protection: None  Other Topics Concern   Not on file  Social History Narrative   Not on file   Social Determinants of Health   Financial Resource Strain: Not on file  Food Insecurity: Not on file  Transportation Needs: Not on file  Physical Activity: Not on file  Stress: Not on  file  Social Connections: Not on file  Intimate Partner Violence: Not on file    Review of Systems  Constitutional:  Positive for malaise/fatigue. Negative for fever.  HENT: Negative.    Eyes: Negative.   Respiratory: Negative.    Cardiovascular: Negative.   Gastrointestinal:  Positive for heartburn. Negative for abdominal pain, blood in stool, constipation, diarrhea and melena.  Genitourinary: Negative.   Musculoskeletal: Negative.   Skin: Negative.   Neurological: Negative.   Endo/Heme/Allergies:  Positive for environmental allergies.  Psychiatric/Behavioral: Negative.       Objective    BP 120/88 (BP Location: Right Arm, Patient  Position: Sitting, Cuff Size: Normal)   Pulse 76   Temp (!) 97.1 F (36.2 C) (Temporal)   Ht '5\' 1"'$  (1.549 m)   Wt 186 lb 3.2 oz (84.5 kg)   LMP 08/09/2021 (Exact Date)   SpO2 99%   BMI 35.18 kg/m   Physical Exam Vitals and nursing note reviewed.  Constitutional:      General: She is not in acute distress.    Appearance: Normal appearance.  HENT:     Head: Normocephalic and atraumatic.     Right Ear: Tympanic membrane, ear canal and external ear normal.     Left Ear: Tympanic membrane, ear canal and external ear normal.  Eyes:     Conjunctiva/sclera: Conjunctivae normal.  Cardiovascular:     Rate and Rhythm: Normal rate and regular rhythm.     Pulses: Normal pulses.     Heart sounds: Normal heart sounds.  Pulmonary:     Effort: Pulmonary effort is normal.     Breath sounds: Normal breath sounds.  Abdominal:     Palpations: Abdomen is soft.     Tenderness: There is no abdominal tenderness.  Musculoskeletal:        General: Normal range of motion.     Cervical back: Normal range of motion.  Skin:    General: Skin is warm and dry.  Neurological:     General: No focal deficit present.     Mental Status: She is alert and oriented to person, place, and time.  Psychiatric:        Mood and Affect: Mood normal.        Behavior: Behavior normal.        Thought Content: Thought content normal.        Judgment: Judgment normal.    Last CBC Lab Results  Component Value Date   WBC 7.4 08/02/2021   HGB 8.9 (L) 08/02/2021   HCT 32.1 (L) 08/02/2021   MCV 68.4 (L) 08/02/2021   MCH 19.0 (L) 08/02/2021   RDW 19.1 (H) 08/02/2021   PLT 424 (H) 71/07/2692   Last metabolic panel Lab Results  Component Value Date   GLUCOSE 81 08/02/2021   NA 138 08/02/2021   K 3.6 08/02/2021   CL 110 08/02/2021   CO2 20 (L) 08/02/2021   BUN 5 (L) 08/02/2021   CREATININE 0.50 08/02/2021   GFRNONAA >60 08/02/2021   CALCIUM 9.3 08/02/2021   PROT 7.1 07/31/2021   ALBUMIN 3.7 07/31/2021    BILITOT 0.2 (L) 07/31/2021   ALKPHOS 51 07/31/2021   AST 14 (L) 07/31/2021   ALT 13 07/31/2021   ANIONGAP 8 08/02/2021        Assessment & Plan:   Problem List Items Addressed This Visit       Digestive   Dry mouth    Chronic dry mouth, 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  Continue drinking  plenty of fluids.  She can also use Biotene mouthwash.        Other   Anemia - Primary    Hemoglobin was noted to be 8.9 at the ER 2 weeks ago.  Her MCV was 68.4.  She was started on an iron supplement and prenatal vitamin.  She states that she is having more energy back since starting this.  We will recheck her CBC and iron panel in 2 months.  Continue taking the iron supplementation.  We will also place referral to GI for colon cancer screening since she is 70.  She states that her menstrual periods last about 7 days, however does not describe severe bleeding.  Follow-up in 2 months or sooner with concerns.      Relevant Orders   Ambulatory referral to Gastroenterology   Other Visit Diagnoses     Screen for colon cancer       Screen colon cancer with colonoscopy with anemia.    Relevant Orders   Ambulatory referral to Gastroenterology       Return in about 2 months (around 10/13/2021) for CPE.   Charyl Dancer, NP

## 2021-08-12 ENCOUNTER — Encounter: Payer: Self-pay | Admitting: Nurse Practitioner

## 2021-08-12 ENCOUNTER — Ambulatory Visit (INDEPENDENT_AMBULATORY_CARE_PROVIDER_SITE_OTHER): Payer: Self-pay | Admitting: Nurse Practitioner

## 2021-08-12 VITALS — BP 120/88 | HR 76 | Temp 97.1°F | Ht 61.0 in | Wt 186.2 lb

## 2021-08-12 DIAGNOSIS — R682 Dry mouth, unspecified: Secondary | ICD-10-CM

## 2021-08-12 DIAGNOSIS — Z1211 Encounter for screening for malignant neoplasm of colon: Secondary | ICD-10-CM

## 2021-08-12 DIAGNOSIS — D649 Anemia, unspecified: Secondary | ICD-10-CM

## 2021-08-12 NOTE — Assessment & Plan Note (Signed)
Hemoglobin was noted to be 8.9 at the ER 2 weeks ago.  Her MCV was 68.4.  She was started on an iron supplement and prenatal vitamin.  She states that she is having more energy back since starting this.  We will recheck her CBC and iron panel in 2 months.  Continue taking the iron supplementation.  We will also place referral to GI for colon cancer screening since she is 37.  She states that her menstrual periods last about 7 days, however does not describe severe bleeding.  Follow-up in 2 months or sooner with concerns.

## 2021-08-12 NOTE — Assessment & Plan Note (Signed)
Chronic dry mouth, 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  Continue drinking plenty of fluids.  She can also use Biotene mouthwash.

## 2021-08-12 NOTE — Patient Instructions (Signed)
It was great to see you!  Keep taking your iron supplement.   I have placed a referral to the stomach doctor for screening for colon cancer with your low blood counts.   Let's follow-up in 2 months, sooner if you have concerns.  If a referral was placed today, you will be contacted for an appointment. Please note that routine referrals can sometimes take up to 3-4 weeks to process. Please call our office if you haven't heard anything after this time frame.  Take care,  Vance Peper, NP

## 2021-09-15 ENCOUNTER — Encounter: Payer: Self-pay | Admitting: Nurse Practitioner

## 2021-09-15 ENCOUNTER — Ambulatory Visit (INDEPENDENT_AMBULATORY_CARE_PROVIDER_SITE_OTHER): Payer: Self-pay | Admitting: Nurse Practitioner

## 2021-09-15 VITALS — BP 124/80 | HR 80 | Wt 190.8 lb

## 2021-09-15 DIAGNOSIS — D509 Iron deficiency anemia, unspecified: Secondary | ICD-10-CM

## 2021-09-15 DIAGNOSIS — M546 Pain in thoracic spine: Secondary | ICD-10-CM | POA: Insufficient documentation

## 2021-09-15 MED ORDER — CYCLOBENZAPRINE HCL 10 MG PO TABS: 10.0000 mg | ORAL_TABLET | Freq: Three times a day (TID) | ORAL | 0 refills | Status: DC | PRN

## 2021-09-15 MED ORDER — NAPROXEN 500 MG PO TABS: 500.0000 mg | ORAL_TABLET | Freq: Two times a day (BID) | ORAL | 0 refills | Status: DC

## 2021-09-15 NOTE — Assessment & Plan Note (Signed)
She has a acute left-sided thoracic back pain that radiates up to her shoulder and sometimes down to her left buttock.  No red flags on exam.  She denies injury.  Will have her start naproxen twice a day with food and Flexeril 10 mg as needed for muscle tightness and pain.  Stretches given to her to do daily.  Follow-up in 4 weeks.

## 2021-09-15 NOTE — Progress Notes (Signed)
Acute Office Visit  Subjective:     Patient ID: Martha Valencia, female    DOB: 11-19-1975, 46 y.o.   MRN: [id number]  Chief Complaint  Patient presents with   Acute Visit    Pt c/o pain in ribs on LF side is hurting, left shoulder, denies chest pain/back pain/numbness/tingling - no fever/diarrhea/vomitting     HPI Patient is in today for pain on the left side of her ribs that radiates around her back and up to her left shoulder.  She states that sometimes the pain will radiate down to her left buttock as well.  She denies recent injury.  For work, she drives for [employer]  She denies numbness, tingling, urinary frequency, abdominal pain, fevers.  She states that turning will make the pain worse.  She has been having a lot of pain at night which is causing her to have some trouble sleeping.  She has tried some ibuprofen which helped a little bit.  ROS See pertinent positives and negatives per HPI.     Objective:    BP 124/80 (BP Location: Left Arm, Cuff Size: Large)   Pulse 80   Wt 190 lb 12.8 oz (86.5 kg)   SpO2 100%   BMI 36.05 kg/m     Physical Exam Vitals and nursing note reviewed.  Constitutional:      General: She is not in acute distress.    Appearance: Normal appearance.  HENT:     Head: Normocephalic.  Eyes:     Conjunctiva/sclera: Conjunctivae normal.  Cardiovascular:     Rate and Rhythm: Normal rate and regular rhythm.     Pulses: Normal pulses.     Heart sounds: Normal heart sounds.  Pulmonary:     Effort: Pulmonary effort is normal.     Breath sounds: Normal breath sounds.  Musculoskeletal:        General: No swelling, tenderness or deformity.     Cervical back: Normal range of motion.     Comments: Lumbar ROM limited due to pain  Skin:    General: Skin is warm.  Neurological:     General: No focal deficit present.     Mental Status: She is alert and oriented to person, place, and time.  Psychiatric:        Mood and Affect: Mood normal.         Behavior: Behavior normal.        Thought Content: Thought content normal.        Judgment: Judgment normal.       Assessment & Plan:   Problem List Items Addressed This Visit       Other   Anemia - Primary    She has a history of anemia and was started on an iron supplement.  She finished 30 days of this and then stopped the medication.  Will check her CBC and iron panel today.  Most likely will need to restart this iron supplement.      Relevant Orders   CBC with Differential/Platelet   Iron, TIBC and Ferritin Panel   Acute left-sided thoracic back pain    She has a acute left-sided thoracic back pain that radiates up to her shoulder and sometimes down to her left buttock.  No red flags on exam.  She denies injury.  Will have her start naproxen twice a day with food and Flexeril 10 mg as needed for muscle tightness and pain.  Stretches given to her to do daily.  Follow-up  in 4 weeks.      Relevant Medications   naproxen (NAPROSYN) 500 MG tablet   cyclobenzaprine (FLEXERIL) 10 MG tablet    Meds ordered this encounter  Medications   naproxen (NAPROSYN) 500 MG tablet    Sig: Take 1 tablet (500 mg total) by mouth 2 (two) times daily with a meal. Take with food    Dispense:  60 tablet    Refill:  0   cyclobenzaprine (FLEXERIL) 10 MG tablet    Sig: Take 1 tablet (10 mg total) by mouth 3 (three) times daily as needed for muscle spasms.    Dispense:  30 tablet    Refill:  0    Return in about 4 weeks (around 10/13/2021) for back pain.  Charyl Dancer, NP

## 2021-09-15 NOTE — Patient Instructions (Signed)
It was great to see you!  Start naproxen twice a day as needed for pain with food. Start flexeril as needed to help with the pain and for sleep. This may make you sleepy  Start the back stretches daily   Let's follow-up in 4 weeks, sooner if you have concerns.  If a referral was placed today, you will be contacted for an appointment. Please note that routine referrals can sometimes take up to 3-4 weeks to process. Please call our office if you haven't heard anything after this time frame.  Take care,  Vance Peper, NP

## 2021-09-15 NOTE — Assessment & Plan Note (Signed)
She has a history of anemia and was started on an iron supplement.  She finished 30 days of this and then stopped the medication.  Will check her CBC and iron panel today.  Most likely will need to restart this iron supplement.

## 2021-09-16 LAB — CBC WITH DIFFERENTIAL/PLATELET
Basophils Absolute: 0.1 10*3/uL (ref 0.0–0.1)
Basophils Relative: 1 % (ref 0.0–3.0)
Eosinophils Absolute: 0.1 10*3/uL (ref 0.0–0.7)
Eosinophils Relative: 1.4 % (ref 0.0–5.0)
HCT: 33.6 % — ABNORMAL LOW (ref 36.0–46.0)
Hemoglobin: 10.6 g/dL — ABNORMAL LOW (ref 12.0–15.0)
Lymphocytes Relative: 25 % (ref 12.0–46.0)
Lymphs Abs: 1.7 10*3/uL (ref 0.7–4.0)
MCHC: 31.5 g/dL (ref 30.0–36.0)
MCV: 76.7 fl — ABNORMAL LOW (ref 78.0–100.0)
Monocytes Absolute: 0.5 10*3/uL (ref 0.1–1.0)
Monocytes Relative: 7 % (ref 3.0–12.0)
Neutro Abs: 4.3 10*3/uL (ref 1.4–7.7)
Neutrophils Relative %: 65.6 % (ref 43.0–77.0)
Platelets: 401 10*3/uL — ABNORMAL HIGH (ref 150.0–400.0)
RBC: 4.38 Mil/uL (ref 3.87–5.11)
RDW: 27.3 % — ABNORMAL HIGH (ref 11.5–15.5)
WBC: 6.6 10*3/uL (ref 4.0–10.5)

## 2021-09-16 LAB — IRON,TIBC AND FERRITIN PANEL
%SAT: 10 % (calc) — ABNORMAL LOW (ref 16–45)
Ferritin: 7 ng/mL — ABNORMAL LOW (ref 16–232)
Iron: 44 ug/dL (ref 40–190)
TIBC: 429 mcg/dL (calc) (ref 250–450)

## 2021-09-16 MED ORDER — FERROUS SULFATE 325 (65 FE) MG PO TABS: 325.0000 mg | ORAL_TABLET | Freq: Every day | ORAL | 1 refills | Status: AC

## 2021-09-16 NOTE — Addendum Note (Signed)
Addended by: Vance Peper A on: 09/16/2021 11:01 AM   Modules accepted: Orders

## 2021-09-16 NOTE — Progress Notes (Signed)
Called and left detailed vm regarding results. Sw, cma

## 2021-10-12 ENCOUNTER — Other Ambulatory Visit: Payer: Self-pay | Admitting: Nurse Practitioner

## 2021-10-12 NOTE — Telephone Encounter (Signed)
rx approved

## 2021-10-21 NOTE — Progress Notes (Unsigned)
There were no vitals taken for this visit.   Subjective:    Patient ID: Martha Valencia, female    DOB: 04-Oct-1975, 46 y.o.   MRN: [id number]  CC: No chief complaint on file.  HPI: Martha Valencia is a 46 y.o. female presenting on 10/22/2021 for comprehensive medical examination. Current medical complaints include:{Blank single:19197::"none","***"}  She currently lives with: Menopausal Symptoms: {Blank single:19197::"yes","no"}  Depression Screen done today and results listed below:     08/12/2021   10:38 AM  Depression screen PHQ 2/9  Decreased Interest 0  Down, Depressed, Hopeless 0  PHQ - 2 Score 0    The patient {has/does not have:19849} a history of falls. I {did/did not:19850} complete a risk assessment for falls. A plan of care for falls {was/was not:19852} documented.   Past Medical History:  Past Medical History:  Diagnosis Date   Anemia    Pregnancy of unknown anatomic location 07/05/2016   Vaginal bleeding in pregnancy, first trimester 07/05/2016    Surgical History:  Past Surgical History:  Procedure Laterality Date   NO PAST SURGERIES      Medications:  Current Outpatient Medications on File Prior to Visit  Medication Sig   cyclobenzaprine (FLEXERIL) 10 MG tablet Take 1 tablet (10 mg total) by mouth 3 (three) times daily as needed for muscle spasms.   ferrous sulfate 325 (65 FE) MG tablet Take 1 tablet (325 mg total) by mouth daily.   naproxen (NAPROSYN) 500 MG tablet TAKE 1 TABLET (500 MG TOTAL) BY MOUTH 2 (TWO) TIMES DAILY WITH A MEAL. TAKE WITH FOOD   Prenatal Vit-Fe Fumarate-FA (PRENATAL MULTIVITAMIN) TABS tablet Take 1 tablet by mouth daily at 12 noon. (Patient not taking: Reported on 09/15/2021)   No current facility-administered medications on file prior to visit.    Allergies:  No Known Allergies  Social History:  Social History   Socioeconomic History   Marital status: Married    Spouse name: Not on file   Number of children: Not on file    Years of education: Not on file   Highest education level: Not on file  Occupational History   Not on file  Tobacco Use   Smoking status: Never   Smokeless tobacco: Never  Vaping Use   Vaping Use: Never used  Substance and Sexual Activity   Alcohol use: No   Drug use: No   Sexual activity: Yes    Birth control/protection: None  Other Topics Concern   Not on file  Social History Narrative   Not on file   Social Determinants of Health   Financial Resource Strain: Not on file  Food Insecurity: Not on file  Transportation Needs: Not on file  Physical Activity: Not on file  Stress: Not on file  Social Connections: Not on file  Intimate Partner Violence: Not on file   Social History   Tobacco Use  Smoking Status Never  Smokeless Tobacco Never   Social History   Substance and Sexual Activity  Alcohol Use No    Family History:  No family history on file.  Past medical history, surgical history, medications, allergies, family history and social history reviewed with patient today and changes made to appropriate areas of the chart.   ROS All other ROS negative except what is listed above and in the HPI.      Objective:    There were no vitals taken for this visit.  Wt Readings from Last 3 Encounters:  09/15/21 190 lb 12.8 oz (  86.5 kg)  08/12/21 186 lb 3.2 oz (84.5 kg)  07/18/16 179 lb (81.2 kg)    Physical Exam  Results for orders placed or performed in visit on 09/15/21  CBC with Differential/Platelet  Result Value Ref Range   WBC 6.6 4.0 - 10.5 K/uL   RBC 4.38 3.87 - 5.11 Mil/uL   Hemoglobin 10.6 (L) 12.0 - 15.0 g/dL   HCT 33.6 (L) 36.0 - 46.0 %   MCV 76.7 (L) 78.0 - 100.0 fl   MCHC 31.5 30.0 - 36.0 g/dL   RDW 27.3 (H) 11.5 - 15.5 %   Platelets 401.0 (H) 150.0 - 400.0 K/uL   Neutrophils Relative % 65.6 43.0 - 77.0 %   Lymphocytes Relative 25.0 12.0 - 46.0 %   Monocytes Relative 7.0 3.0 - 12.0 %   Eosinophils Relative 1.4 0.0 - 5.0 %   Basophils  Relative 1.0 0.0 - 3.0 %   Neutro Abs 4.3 1.4 - 7.7 K/uL   Lymphs Abs 1.7 0.7 - 4.0 K/uL   Monocytes Absolute 0.5 0.1 - 1.0 K/uL   Eosinophils Absolute 0.1 0.0 - 0.7 K/uL   Basophils Absolute 0.1 0.0 - 0.1 K/uL  Iron, TIBC and Ferritin Panel  Result Value Ref Range   Iron 44 40 - 190 mcg/dL   TIBC 429 250 - 450 mcg/dL (calc)   %SAT 10 (L) 16 - 45 % (calc)   Ferritin 7 (L) 16 - 232 ng/mL      Assessment & Plan:   Problem List Items Addressed This Visit   None    Follow up plan: No follow-ups on file.   LABORATORY TESTING:  - Pap smear: {Blank TZGYFV:49449::"QPR done","not applicable","up to date","done elsewhere"}  IMMUNIZATIONS:   - Tdap: Tetanus vaccination status reviewed: {tetanus status:315746}. - Influenza: {Blank single:19197::"Up to date","Administered today","Postponed to flu season","Refused","Given elsewhere"} - Pneumovax: {Blank single:19197::"Up to date","Administered today","Not applicable","Refused","Given elsewhere"} - Prevnar: {Blank single:19197::"Up to date","Administered today","Not applicable","Refused","Given elsewhere"} - HPV: {Blank single:19197::"Up to date","Administered today","Not applicable","Refused","Given elsewhere"} - Zostavax vaccine: {Blank single:19197::"Up to date","Administered today","Not applicable","Refused","Given elsewhere"}  SCREENING: -Mammogram: {Blank single:19197::"Up to date","Ordered today","Not applicable","Refused","Done elsewhere"}  - Colonoscopy: {Blank single:19197::"Up to date","Ordered today","Not applicable","Refused","Done elsewhere"}  - Bone Density: {Blank single:19197::"Up to date","Ordered today","Not applicable","Refused","Done elsewhere"}  -Hearing Test: {Blank single:19197::"Up to date","Ordered today","Not applicable","Refused","Done elsewhere"}  -Spirometry: {Blank single:19197::"Up to date","Ordered today","Not applicable","Refused","Done elsewhere"}   PATIENT COUNSELING:   Advised to take 1 mg of folate  supplement per day if capable of pregnancy.   Sexuality: Discussed sexually transmitted diseases, partner selection, use of condoms, avoidance of unintended pregnancy  and contraceptive alternatives.   Advised to avoid cigarette smoking.  I discussed with the patient that most people either abstain from alcohol or drink within safe limits (<=14/week and <=4 drinks/occasion for males, <=7/weeks and <= 3 drinks/occasion for females) and that the risk for alcohol disorders and other health effects rises proportionally with the number of drinks per week and how often a drinker exceeds daily limits.  Discussed cessation/primary prevention of drug use and availability of treatment for abuse.   Diet: Encouraged to adjust caloric intake to maintain  or achieve ideal body weight, to reduce intake of dietary saturated fat and total fat, to limit sodium intake by avoiding high sodium foods and not adding table salt, and to maintain adequate dietary potassium and calcium preferably from fresh fruits, vegetables, and low-fat dairy products.    stressed the importance of regular exercise  Injury prevention: Discussed safety belts, safety helmets, smoke detector, smoking  near bedding or upholstery.   Dental health: Discussed importance of regular tooth brushing, flossing, and dental visits.    NEXT PREVENTATIVE PHYSICAL DUE IN 1 YEAR. No follow-ups on file.

## 2021-10-22 ENCOUNTER — Ambulatory Visit (INDEPENDENT_AMBULATORY_CARE_PROVIDER_SITE_OTHER): Payer: Self-pay | Admitting: Nurse Practitioner

## 2021-10-22 ENCOUNTER — Encounter: Payer: Self-pay | Admitting: Nurse Practitioner

## 2021-10-22 ENCOUNTER — Other Ambulatory Visit (HOSPITAL_COMMUNITY)
Admission: RE | Admit: 2021-10-22 | Discharge: 2021-10-22 | Disposition: A | Discharge status: Home / Self Care | Payer: Medicaid Other | Source: Ambulatory Visit | Attending: Nurse Practitioner | Admitting: Nurse Practitioner

## 2021-10-22 VITALS — BP 120/82 | HR 78 | Temp 97.2°F | Ht 62.0 in | Wt 191.0 lb

## 2021-10-22 DIAGNOSIS — D509 Iron deficiency anemia, unspecified: Secondary | ICD-10-CM

## 2021-10-22 DIAGNOSIS — Z124 Encounter for screening for malignant neoplasm of cervix: Secondary | ICD-10-CM | POA: Diagnosis present

## 2021-10-22 DIAGNOSIS — Z1159 Encounter for screening for other viral diseases: Secondary | ICD-10-CM

## 2021-10-22 DIAGNOSIS — E669 Obesity, unspecified: Secondary | ICD-10-CM

## 2021-10-22 DIAGNOSIS — Z0000 Encounter for general adult medical examination without abnormal findings: Secondary | ICD-10-CM

## 2021-10-22 DIAGNOSIS — Z13220 Encounter for screening for lipoid disorders: Secondary | ICD-10-CM

## 2021-10-22 LAB — LIPID PANEL
Cholesterol: 187 mg/dL (ref 0–200)
HDL: 65.1 mg/dL (ref >39.00)
LDL Cholesterol: 109 mg/dL — ABNORMAL HIGH (ref 0–99)
NonHDL: 121.64
Total CHOL/HDL Ratio: 3
Triglycerides: 64 mg/dL (ref 0.0–149.0)
VLDL: 12.8 mg/dL (ref 0.0–40.0)

## 2021-10-22 LAB — COMPREHENSIVE METABOLIC PANEL
ALT: 16 U/L (ref 0–35)
AST: 14 U/L (ref 0–37)
Albumin: 4 g/dL (ref 3.5–5.2)
Alkaline Phosphatase: 62 U/L (ref 39–117)
BUN: 8 mg/dL (ref 6–23)
CO2: 25 mEq/L (ref 19–32)
Calcium: 8.8 mg/dL (ref 8.4–10.5)
Chloride: 105 mEq/L (ref 96–112)
Creatinine, Ser: 0.62 mg/dL (ref 0.40–1.20)
GFR: 107.72 mL/min (ref >60.00)
Glucose, Bld: 107 mg/dL — ABNORMAL HIGH (ref 70–99)
Potassium: 3.4 mEq/L — ABNORMAL LOW (ref 3.5–5.1)
Sodium: 138 mEq/L (ref 135–145)
Total Bilirubin: 0.3 mg/dL (ref 0.2–1.2)
Total Protein: 7.3 g/dL (ref 6.0–8.3)

## 2021-10-22 LAB — CBC
HCT: 38 % (ref 36.0–46.0)
Hemoglobin: 12.5 g/dL (ref 12.0–15.0)
MCHC: 33 g/dL (ref 30.0–36.0)
MCV: 84.4 fl (ref 78.0–100.0)
Platelets: 279 10*3/uL (ref 150.0–400.0)
RBC: 4.5 Mil/uL (ref 3.87–5.11)
RDW: 20.1 % — ABNORMAL HIGH (ref 11.5–15.5)
WBC: 6.7 10*3/uL (ref 4.0–10.5)

## 2021-10-22 NOTE — Assessment & Plan Note (Signed)
BMI 34.9. Discussed nutrition, exercise.

## 2021-10-22 NOTE — Assessment & Plan Note (Signed)
Continue iron supplement daily. Last ferritin level 4 weeks ago was 7. Follow-up in 6 months.

## 2021-10-22 NOTE — Patient Instructions (Addendum)
It was great to see you!  We are checking your labs today and will let you know the results via mychart/phone.   Let's follow-up in 6 months, sooner if you have concerns.  If a referral was placed today, you will be contacted for an appointment. Please note that routine referrals can sometimes take up to 3-4 weeks to process. Please call our office if you haven't heard anything after this time frame.  Take care,  Kaly Mcquary, NP  

## 2021-10-25 ENCOUNTER — Telehealth: Payer: Self-pay | Admitting: Nurse Practitioner

## 2021-10-25 NOTE — Telephone Encounter (Signed)
Called pt and gave results

## 2021-10-25 NOTE — Progress Notes (Signed)
Called and informed patient of results and provider instructions. Patient voiced understanding. Sw, cma

## 2021-10-25 NOTE — Progress Notes (Signed)
Called and left detailed vm regarding results. Sw, cma

## 2021-10-25 NOTE — Telephone Encounter (Signed)
Pt returned your call from earlier. She thinks it's about some lab results

## 2021-10-26 LAB — HEPATITIS C ANTIBODY: Hepatitis C Ab: NONREACTIVE

## 2021-10-28 LAB — CYTOLOGY - PAP
Comment: NEGATIVE
Diagnosis: NEGATIVE
Diagnosis: REACTIVE
High risk HPV: NEGATIVE

## 2021-10-28 MED ORDER — FLUCONAZOLE 150 MG PO TABS: 150.0000 mg | ORAL_TABLET | Freq: Once | ORAL | 0 refills | Status: AC

## 2021-10-28 NOTE — Addendum Note (Signed)
Addended by: Vance Peper A on: 10/28/2021 08:10 AM   Modules accepted: Orders

## 2022-01-17 ENCOUNTER — Encounter: Payer: Self-pay | Admitting: Nurse Practitioner

## 2023-05-08 ENCOUNTER — Encounter: Payer: Self-pay | Admitting: Nurse Practitioner

## 2023-09-04 ENCOUNTER — Emergency Department (HOSPITAL_COMMUNITY)

## 2023-09-04 ENCOUNTER — Inpatient Hospital Stay (HOSPITAL_COMMUNITY)
Admission: EM | Admit: 2023-09-04 | Discharge: 2023-09-06 | DRG: 871 | Disposition: A | Discharge status: Home / Self Care | Attending: Internal Medicine | Admitting: Internal Medicine

## 2023-09-04 ENCOUNTER — Encounter (HOSPITAL_COMMUNITY): Payer: Self-pay | Admitting: Internal Medicine

## 2023-09-04 ENCOUNTER — Other Ambulatory Visit: Payer: Self-pay

## 2023-09-04 ENCOUNTER — Ambulatory Visit: Payer: Self-pay

## 2023-09-04 DIAGNOSIS — I10 Essential (primary) hypertension: Secondary | ICD-10-CM | POA: Diagnosis present

## 2023-09-04 DIAGNOSIS — E860 Dehydration: Secondary | ICD-10-CM | POA: Diagnosis present

## 2023-09-04 DIAGNOSIS — E1110 Type 2 diabetes mellitus with ketoacidosis without coma: Secondary | ICD-10-CM | POA: Diagnosis present

## 2023-09-04 DIAGNOSIS — A419 Sepsis, unspecified organism: Principal | ICD-10-CM | POA: Diagnosis present

## 2023-09-04 DIAGNOSIS — N390 Urinary tract infection, site not specified: Secondary | ICD-10-CM | POA: Diagnosis present

## 2023-09-04 DIAGNOSIS — E119 Type 2 diabetes mellitus without complications: Secondary | ICD-10-CM

## 2023-09-04 DIAGNOSIS — Z79899 Other long term (current) drug therapy: Secondary | ICD-10-CM | POA: Diagnosis not present

## 2023-09-04 DIAGNOSIS — E876 Hypokalemia: Secondary | ICD-10-CM | POA: Diagnosis present

## 2023-09-04 DIAGNOSIS — R531 Weakness: Secondary | ICD-10-CM | POA: Diagnosis not present

## 2023-09-04 DIAGNOSIS — E11649 Type 2 diabetes mellitus with hypoglycemia without coma: Secondary | ICD-10-CM | POA: Diagnosis not present

## 2023-09-04 LAB — URINALYSIS, ROUTINE W REFLEX MICROSCOPIC
Bilirubin Urine: NEGATIVE
Glucose, UA: >=500 mg/dL — AB
Ketones, ur: 80 mg/dL — AB
Nitrite: NEGATIVE
Protein, ur: 100 mg/dL — AB
RBC / HPF: >50 RBC/hpf (ref 0–5)
Specific Gravity, Urine: 1.036 — ABNORMAL HIGH (ref 1.005–1.030)
WBC, UA: >50 WBC/hpf (ref 0–5)
pH: 5 (ref 5.0–8.0)

## 2023-09-04 LAB — CBG MONITORING, ED
Glucose-Capillary: 457 mg/dL — ABNORMAL HIGH (ref 70–99)
Glucose-Capillary: 356 mg/dL — ABNORMAL HIGH (ref 70–99)
Glucose-Capillary: 308 mg/dL — ABNORMAL HIGH (ref 70–99)
Glucose-Capillary: 239 mg/dL — ABNORMAL HIGH (ref 70–99)
Glucose-Capillary: 189 mg/dL — ABNORMAL HIGH (ref 70–99)
Glucose-Capillary: 169 mg/dL — ABNORMAL HIGH (ref 70–99)
Glucose-Capillary: 88 mg/dL (ref 70–99)

## 2023-09-04 LAB — GLUCOSE, CAPILLARY
Glucose-Capillary: 95 mg/dL (ref 70–99)
Glucose-Capillary: 128 mg/dL — ABNORMAL HIGH (ref 70–99)
Glucose-Capillary: 167 mg/dL — ABNORMAL HIGH (ref 70–99)
Glucose-Capillary: 143 mg/dL — ABNORMAL HIGH (ref 70–99)

## 2023-09-04 LAB — COMPREHENSIVE METABOLIC PANEL WITH GFR
ALT: 11 U/L (ref 0–44)
AST: 14 U/L — ABNORMAL LOW (ref 15–41)
Albumin: 3.8 g/dL (ref 3.5–5.0)
Alkaline Phosphatase: 80 U/L (ref 38–126)
Anion gap: 16 — ABNORMAL HIGH (ref 5–15)
BUN: 6 mg/dL (ref 6–20)
CO2: 17 mmol/L — ABNORMAL LOW (ref 22–32)
Calcium: 9 mg/dL (ref 8.9–10.3)
Chloride: 103 mmol/L (ref 98–111)
Creatinine, Ser: 0.61 mg/dL (ref 0.44–1.00)
GFR, Estimated: >60 mL/min (ref >60)
Glucose, Bld: 445 mg/dL — ABNORMAL HIGH (ref 70–99)
Potassium: 3.2 mmol/L — ABNORMAL LOW (ref 3.5–5.1)
Sodium: 136 mmol/L (ref 135–145)
Total Bilirubin: 1.1 mg/dL (ref 0.0–1.2)
Total Protein: 7.3 g/dL (ref 6.5–8.1)

## 2023-09-04 LAB — BASIC METABOLIC PANEL WITH GFR
Anion gap: 10 (ref 5–15)
Anion gap: 10 (ref 5–15)
BUN: 7 mg/dL (ref 6–20)
BUN: 7 mg/dL (ref 6–20)
CO2: 15 mmol/L — ABNORMAL LOW (ref 22–32)
CO2: 17 mmol/L — ABNORMAL LOW (ref 22–32)
Calcium: 8.1 mg/dL — ABNORMAL LOW (ref 8.9–10.3)
Calcium: 7.9 mg/dL — ABNORMAL LOW (ref 8.9–10.3)
Chloride: 108 mmol/L (ref 98–111)
Chloride: 111 mmol/L (ref 98–111)
Creatinine, Ser: 0.43 mg/dL — ABNORMAL LOW (ref 0.44–1.00)
Creatinine, Ser: 0.37 mg/dL — ABNORMAL LOW (ref 0.44–1.00)
GFR, Estimated: >60 mL/min (ref >60)
GFR, Estimated: >60 mL/min (ref >60)
Glucose, Bld: 232 mg/dL — ABNORMAL HIGH (ref 70–99)
Glucose, Bld: 104 mg/dL — ABNORMAL HIGH (ref 70–99)
Potassium: 3.4 mmol/L — ABNORMAL LOW (ref 3.5–5.1)
Potassium: 3.1 mmol/L — ABNORMAL LOW (ref 3.5–5.1)
Sodium: 133 mmol/L — ABNORMAL LOW (ref 135–145)
Sodium: 138 mmol/L (ref 135–145)

## 2023-09-04 LAB — I-STAT VENOUS BLOOD GAS, ED
Acid-base deficit: 9 mmol/L — ABNORMAL HIGH (ref 0.0–2.0)
Bicarbonate: 15.6 mmol/L — ABNORMAL LOW (ref 20.0–28.0)
Calcium, Ion: 1.16 mmol/L (ref 1.15–1.40)
HCT: 39 % (ref 36.0–46.0)
Hemoglobin: 13.3 g/dL (ref 12.0–15.0)
O2 Saturation: 92 %
Potassium: 3.5 mmol/L (ref 3.5–5.1)
Sodium: 139 mmol/L (ref 135–145)
TCO2: 17 mmol/L — ABNORMAL LOW (ref 22–32)
pCO2, Ven: 29.8 mmHg — ABNORMAL LOW (ref 44–60)
pH, Ven: 7.328 (ref 7.25–7.43)
pO2, Ven: 68 mmHg — ABNORMAL HIGH (ref 32–45)

## 2023-09-04 LAB — BETA-HYDROXYBUTYRIC ACID
Beta-Hydroxybutyric Acid: 4.48 mmol/L — ABNORMAL HIGH (ref 0.05–0.27)
Beta-Hydroxybutyric Acid: 3.12 mmol/L — ABNORMAL HIGH (ref 0.05–0.27)
Beta-Hydroxybutyric Acid: 2.41 mmol/L — ABNORMAL HIGH (ref 0.05–0.27)

## 2023-09-04 LAB — CBC
HCT: 41 % (ref 36.0–46.0)
Hemoglobin: 14.1 g/dL (ref 12.0–15.0)
MCH: 29.8 pg (ref 26.0–34.0)
MCHC: 34.4 g/dL (ref 30.0–36.0)
MCV: 86.7 fL (ref 80.0–100.0)
Platelets: 359 K/uL (ref 150–400)
RBC: 4.73 MIL/uL (ref 3.87–5.11)
RDW: 13.1 % (ref 11.5–15.5)
WBC: 7.7 K/uL (ref 4.0–10.5)
nRBC: 0 % (ref 0.0–0.2)

## 2023-09-04 LAB — MRSA NEXT GEN BY PCR, NASAL: MRSA by PCR Next Gen: NOT DETECTED

## 2023-09-04 LAB — HEMOGLOBIN A1C
Hgb A1c MFr Bld: 11.8 % — ABNORMAL HIGH (ref 4.8–5.6)
Mean Plasma Glucose: 291.96 mg/dL

## 2023-09-04 LAB — MAGNESIUM: Magnesium: 1.7 mg/dL (ref 1.7–2.4)

## 2023-09-04 LAB — HIV ANTIBODY (ROUTINE TESTING W REFLEX): HIV Screen 4th Generation wRfx: NONREACTIVE

## 2023-09-04 LAB — HCG, SERUM, QUALITATIVE: Preg, Serum: NEGATIVE

## 2023-09-04 MED ORDER — POTASSIUM CHLORIDE CRYS ER 20 MEQ PO TBCR
40.0000 meq | EXTENDED_RELEASE_TABLET | Freq: Once | ORAL | Status: AC
  Administered 2023-09-04: 40 meq via ORAL
  Filled 2023-09-04: qty 2

## 2023-09-04 MED ORDER — LACTATED RINGERS IV BOLUS
1000.0000 mL | Freq: Once | INTRAVENOUS | Status: AC
  Administered 2023-09-04: 1000 mL via INTRAVENOUS

## 2023-09-04 MED ORDER — DEXTROSE 50 % IV SOLN: 0.0000 mL | INTRAVENOUS | Status: DC | PRN

## 2023-09-04 MED ORDER — SODIUM CHLORIDE 0.9 % IV SOLN
2.0000 g | INTRAVENOUS | Status: DC
  Administered 2023-09-04 – 2023-09-05 (×2): 2 g via INTRAVENOUS
  Filled 2023-09-04 (×2): qty 20

## 2023-09-04 MED ORDER — POTASSIUM CHLORIDE 10 MEQ/100ML IV SOLN
10.0000 meq | INTRAVENOUS | Status: AC
  Administered 2023-09-04 (×2): 10 meq via INTRAVENOUS
  Filled 2023-09-04 (×2): qty 100

## 2023-09-04 MED ORDER — SODIUM CHLORIDE 0.9 % IV SOLN: 1.0000 g | Freq: Once | INTRAVENOUS | Status: DC

## 2023-09-04 MED ORDER — POTASSIUM CHLORIDE 10 MEQ/100ML IV SOLN
10.0000 meq | Freq: Once | INTRAVENOUS | Status: AC
  Administered 2023-09-04: 10 meq via INTRAVENOUS
  Filled 2023-09-04: qty 100

## 2023-09-04 MED ORDER — LIVING WELL WITH DIABETES BOOK
Freq: Once | Status: DC
  Filled 2023-09-04: qty 1

## 2023-09-04 MED ORDER — INSULIN ASPART 100 UNIT/ML IJ SOLN
10.0000 [IU] | Freq: Once | INTRAMUSCULAR | Status: AC
  Administered 2023-09-04: 10 [IU] via SUBCUTANEOUS

## 2023-09-04 MED ORDER — DEXTROSE IN LACTATED RINGERS 5 % IV SOLN: INTRAVENOUS | Status: DC

## 2023-09-04 MED ORDER — HEPARIN SODIUM (PORCINE) 5000 UNIT/ML IJ SOLN
5000.0000 [IU] | Freq: Three times a day (TID) | INTRAMUSCULAR | Status: DC
  Administered 2023-09-04 – 2023-09-05 (×2): 5000 [IU] via SUBCUTANEOUS
  Filled 2023-09-04 (×3): qty 1

## 2023-09-04 MED ORDER — SODIUM CHLORIDE 0.9 % IV BOLUS
1000.0000 mL | Freq: Once | INTRAVENOUS | Status: AC
  Administered 2023-09-04: 1000 mL via INTRAVENOUS

## 2023-09-04 MED ORDER — MAGNESIUM SULFATE 2 GM/50ML IV SOLN
2.0000 g | Freq: Once | INTRAVENOUS | Status: AC
  Administered 2023-09-04: 2 g via INTRAVENOUS
  Filled 2023-09-04: qty 50

## 2023-09-04 MED ORDER — INSULIN REGULAR(HUMAN) IN NACL 100-0.9 UT/100ML-% IV SOLN
INTRAVENOUS | Status: DC
  Administered 2023-09-04: 6.5 [IU]/h via INTRAVENOUS
  Filled 2023-09-04: qty 100

## 2023-09-04 MED ORDER — INSULIN STARTER KIT- PEN NEEDLES (ENGLISH)
1.0000 | Freq: Once | Status: DC
  Filled 2023-09-04 (×2): qty 1

## 2023-09-04 MED ORDER — LACTATED RINGERS IV SOLN: INTRAVENOUS | Status: DC

## 2023-09-04 NOTE — ED Notes (Signed)
 No report received - chart reviewed

## 2023-09-04 NOTE — H&P (Signed)
 History and Physical    Patient: Martha Valencia FMW:[id number] DOB: [birth year] DOA: 09/04/2023 DOS: the patient was seen and examined on 09/04/2023 . PCP: Nedra Tinnie LABOR, NP  Patient coming from: Home Chief complaint: Chief Complaint  Patient presents with   Weakness   HPI:  Martha HEIDEL is a 48 y.o. female with past medical history  of  Htn coming for  Weakness 1 month, dizziness, lack of energy and also reports polyuria and polydipsia and yeast infection. Weight is 86.6 kg.no weight changes. Pt also has c/o dysuria.  No other complaints of fevers chills flank pain hematuria.  Back pain nausea vomiting chest pain palpitations.  ED Course:  Vital signs in the ED were notable for the following:  Vitals:   09/04/23 1021 09/04/23 1200 09/04/23 1500 09/04/23 1723  BP:  (!) 142/78 108/70 107/75  Pulse:  (!) 107 97 83  Temp:  98.2 F (36.8 C) 98.3 F (36.8 C) 97.8 F (36.6 C)  Resp:  (!) 23 18 (!) 26  Height: 5' 4 (1.626 m)     SpO2:  100% 100% 100%  TempSrc:  Oral Oral Oral  >>ED evaluation thus far shows: EKG shows sinus rhythm 86 PR 149 QTc 435, upright axis, no ST elevations or depressions or T wave inversion. Patient meet sepsis criteria blood cultures collected. Venous blood gas shows pH of 7.3 pCO2 29.8 pO2 68 bicarb of 15.6. CMP shows potassium 3.2 bicarb 17 glucose 445, hemoglobin A1c of 11.8 anion gap 16 normal kidney function normal LFTs. Normal CBC. Abnormal urinalysis cloudy urine large hemoglobin moderate leukocytes more than 50 WBCs, urine culture collected. Chest x-ray today is negative for any acute cardiopulmonary disease and underinflation.  >>While in the ED patient received the following: Medications  sodium chloride  0.9 % bolus 1,000 mL (1,000 mLs Intravenous New Bag/Given 09/04/23 1157)  magnesium  sulfate IVPB 2 g 50 mL (0 g Intravenous Stopped 09/04/23 1243)  lactated ringers  bolus 1,000 mL (1,000 mLs Intravenous New Bag/Given 09/04/23 1323)   potassium chloride  SA (KLOR-CON  M) CR tablet 40 mEq (40 mEq Oral Given 09/04/23 1317)  potassium chloride  10 mEq in 100 mL IVPB (0 mEq Intravenous Stopped 09/04/23 1457)  insulin  aspart (novoLOG ) injection 10 Units (10 Units Subcutaneous Given 09/04/23 1317)   Review of Systems  Constitutional:  Positive for malaise/fatigue.  Genitourinary:  Positive for dysuria.  Neurological:  Positive for weakness.  Endo/Heme/Allergies:  Positive for polydipsia.   Past Medical History:  Diagnosis Date   Anemia    Pregnancy of unknown anatomic location 07/05/2016   Vaginal bleeding in pregnancy, first trimester 07/05/2016   Past Surgical History:  Procedure Laterality Date   NO PAST SURGERIES      reports that she has never smoked. She has never used smokeless tobacco. She reports that she does not drink alcohol and does not use drugs. No Known Allergies No family history on file. Prior to Admission medications   Medication Sig Start Date End Date Taking? Authorizing Provider  aspirin-acetaminophen -caffeine (EXCEDRIN MIGRAINE) 250-250-65 MG tablet Take 1 tablet by mouth daily as needed for headache.   Yes [provider]  ferrous sulfate  325 (65 FE) MG tablet Take 1 tablet (325 mg total) by mouth daily. 09/16/21  Yes McElwee, Tinnie LABOR, NP  Vitals:   09/04/23 1021 09/04/23 1200 09/04/23 1500 09/04/23 1723  BP:  (!) 142/78 108/70 107/75  Pulse:  (!) 107 97 83  Resp:  (!) 23 18 (!) 26  Temp:  98.2 F (36.8 C) 98.3 F (36.8 C) 97.8 F (36.6 C)  TempSrc:  Oral Oral Oral  SpO2:  100% 100% 100%  Height: 5' 4 (1.626 m)      Physical Exam Vitals and nursing note reviewed.  Constitutional:      General: She is not in acute distress. HENT:     Head: Normocephalic and atraumatic.     Right Ear: Hearing normal.     Left Ear: Hearing normal.     Nose: No nasal deformity.     Mouth/Throat:     Lips: Pink.   Eyes:     General: Lids are normal.     Extraocular Movements: Extraocular movements intact.  Cardiovascular:     Rate and Rhythm: Regular rhythm. Tachycardia present.     Heart sounds: Normal heart sounds.  Pulmonary:     Effort: Pulmonary effort is normal.     Breath sounds: Normal breath sounds.  Abdominal:     General: Bowel sounds are normal. There is no distension.     Palpations: Abdomen is soft. There is no mass.     Tenderness: There is no abdominal tenderness.  Musculoskeletal:     Right lower leg: No edema.     Left lower leg: No edema.  Skin:    General: Skin is warm.  Neurological:     General: No focal deficit present.     Mental Status: She is alert and oriented to person, place, and time.     Cranial Nerves: Cranial nerves 2-12 are intact.  Psychiatric:        Speech: Speech normal.     Labs on Admission: I have personally reviewed following labs and imaging studies CBC: Recent Labs  Lab 09/04/23 1022 09/04/23 1336  WBC 7.7  --   HGB 14.1 13.3  HCT 41.0 39.0  MCV 86.7  --   PLT 359  --    Basic Metabolic Panel: Recent Labs  Lab 09/04/23 1130 09/04/23 1336 09/04/23 1534  NA 136 139 133*  K 3.2* 3.5 3.4*  CL 103  --  108  CO2 17*  --  15*  GLUCOSE 445*  --  232*  BUN 6  --  7  CREATININE 0.61  --  0.43*  CALCIUM 9.0  --  8.1*  MG 1.7  --   --    GFR: CrCl cannot be calculated (Unknown ideal weight.). Liver Function Tests: Recent Labs  Lab 09/04/23 1130  AST 14*  ALT 11  ALKPHOS 80  BILITOT 1.1  PROT 7.3  ALBUMIN 3.8   No results for input(s): LIPASE, AMYLASE in the last 168 hours. No results for input(s): AMMONIA in the last 168 hours. Coagulation Profile: No results for input(s): INR, PROTIME in the last 168 hours. Cardiac Enzymes: No results for input(s): CKTOTAL, CKMB, CKMBINDEX, TROPONINI in the last 168 hours. BNP (last 3 results) No results for input(s): PROBNP in the last 8760 hours. HbA1C: Recent  Labs    09/04/23 1130  HGBA1C 11.8*   CBG: Recent Labs  Lab 09/04/23 1424 09/04/23 1527 09/04/23 1620 09/04/23 1708 09/04/23 1822  GLUCAP 308* 239* 189* 169* 88   Lipid Profile: No results for input(s): CHOL, HDL, LDLCALC, TRIG, CHOLHDL, LDLDIRECT in the last 72 hours. Thyroid Function  Tests: No results for input(s): TSH, T4TOTAL, FREET4, T3FREE, THYROIDAB in the last 72 hours. Anemia Panel: No results for input(s): VITAMINB12, FOLATE, FERRITIN, TIBC, IRON, RETICCTPCT in the last 72 hours. Urine analysis:    Component Value Date/Time   COLORURINE YELLOW 09/04/2023 1022   APPEARANCEUR CLOUDY (A) 09/04/2023 1022   LABSPEC 1.036 (H) 09/04/2023 1022   PHURINE 5.0 09/04/2023 1022   GLUCOSEU >=500 (A) 09/04/2023 1022   HGBUR LARGE (A) 09/04/2023 1022   BILIRUBINUR NEGATIVE 09/04/2023 1022   KETONESUR 80 (A) 09/04/2023 1022   PROTEINUR 100 (A) 09/04/2023 1022   NITRITE NEGATIVE 09/04/2023 1022   LEUKOCYTESUR MODERATE (A) 09/04/2023 1022   Radiological Exams on Admission: DG Chest Portable 1 View Result Date: 09/04/2023 CLINICAL DATA:  Edema. EXAM: PORTABLE CHEST 1 VIEW COMPARISON:  Chest x-ray 07/31/2021 FINDINGS: Underinflation. No consolidation, pneumothorax or effusion. No edema. Normal cardiopericardial silhouette. IMPRESSION: Underinflation.  No acute cardiopulmonary disease. Electronically Signed   By: Ranell Bring M.D.   On: 09/04/2023 12:44   Data Reviewed: Relevant notes from primary care and specialist visits, past discharge summaries as available in EHR, including Care Everywhere . Prior diagnostic testing as pertinent to current admission diagnoses, Updated medications and problem lists for reconciliation .ED course, including vitals, labs, imaging, treatment and response to treatment,Triage notes, nursing and pharmacy notes and ED provider's notes.Notable results as noted in HPI.Discussed case with EDMD/ ED APP/ or Specialty MD on call  and as needed.  Assessment & Plan  >> Poorly controlled diabetes mellitus type 2/DKA: Patient presenting with polyuria polydipsia generalized weakness hyperglycemia and metabolic acidosis without elevated anion gap W uric acid is pending. Admit to stepdown unit with DKA protocol. Diabetic teaching counseling and resources.  >> Sepsis/UTI: Will continue with IV fluid hydration and start patient on Rocephin . Await for culture and sensitivity results for blood in urine.  >> Elevated blood pressure without diagnosis of hypertension:  No blood pressure meds on home medications will wait for med reconciliation and start low-dose ACE Ennever therapy for nephro protection.   >> Borderline low magnesium : Replaced with 2 g in the ED.    DVT prophylaxis:  Heparin   Consults:  None   Advance Care Planning:    Code Status: Full Code   Family Communication:  None  Disposition Plan:  Home  Severity of Illness: The appropriate patient status for this patient is INPATIENT. Inpatient status is judged to be reasonable and necessary in order to provide the required intensity of service to ensure the patient's safety. The patient's presenting symptoms, physical exam findings, and initial radiographic and laboratory data in the context of their chronic comorbidities is felt to place them at high risk for further clinical deterioration. Furthermore, it is not anticipated that the patient will be medically stable for discharge from the hospital within 2 midnights of admission.   * I certify that at the point of admission it is my clinical judgment that the patient will require inpatient hospital care spanning beyond 2 midnights from the point of admission due to high intensity of service, high risk for further deterioration and high frequency of surveillance required.*  Unresulted Labs (From admission, onward)     Start     Ordered   09/04/23 1514  Basic metabolic panel  (Diabetes Ketoacidosis  (DKA))  STAT Now then every 4 hours ,   R      09/04/23 1524   09/04/23 1514  Beta-hydroxybutyric acid  (Diabetes Ketoacidosis (DKA))  Now then every  4 hours,   R      09/04/23 1524   09/04/23 1506  Culture, blood (Routine X 2) w Reflex to ID Panel  BLOOD CULTURE X 2,   R      09/04/23 1506   09/04/23 1420  Urine Culture  Add-on,   AD       Question:  Indication  Answer:  Dysuria   09/04/23 1420            Meds ordered this encounter  Medications   sodium chloride  0.9 % bolus 1,000 mL   magnesium  sulfate IVPB 2 g 50 mL   lactated ringers  bolus 1,000 mL   potassium chloride  SA (KLOR-CON  M) CR tablet 40 mEq   potassium chloride  10 mEq in 100 mL IVPB   insulin  aspart (novoLOG ) injection 10 Units   DISCONTD: cefTRIAXone  (ROCEPHIN ) 1 g in sodium chloride  0.9 % 100 mL IVPB    Antibiotic Indication::   UTI   insulin  regular, human (MYXREDLIN ) 100 units/ 100 mL infusion    EndoTool Goal Range::   140-180    Type of Diabetes:   Type 2    Mode of Therapy:   ENDOX1 for DKA    Start Method:   EndoTool to calculate   lactated ringers  infusion   dextrose  5 % in lactated ringers  infusion   dextrose  50 % solution 0-50 mL   heparin  injection 5,000 Units   cefTRIAXone  (ROCEPHIN ) 2 g in sodium chloride  0.9 % 100 mL IVPB    Antibiotic Indication::   UTI   living well with diabetes book MISC   insulin  starter kit- pen needles (English) 1 kit   potassium chloride  10 mEq in 100 mL IVPB     Orders Placed This Encounter  Procedures   Urine Culture   Culture, blood (Routine X 2) w Reflex to ID Panel   DG Chest Portable 1 View   CBC   Urinalysis, Routine w reflex microscopic -Urine, Clean Catch   hCG, serum, qualitative   Hemoglobin A1c   Comprehensive metabolic panel with GFR   Magnesium    Beta-hydroxybutyric acid   HIV Antibody (routine testing w rflx)   Basic metabolic panel   Beta-hydroxybutyric acid   Diet NPO time specified   Document Height and Actual Weight   Vital signs    Cardiac monitoring   Mobility Protocol: No Restrictions   Refer to Sidebar Report Mobility Protocol for Adult Inpatient   Apply Diabetes Mellitus Care Plan   Apply Diabetic Ketoacidosis Care Plan   Notify physician (specify)   If present, discontinue Insulin  Pump after IV Insulin  is initiated.   Do NOT use lab glucose values in EndoTool.  If CBG meter reads Critical High, enter 600.   Please refer to DKA Protocol sidebar report   Strict intake and output   Initiate Oral Care Protocol   Initiate Carrier Fluid Protocol   Notify physician (specify)   Upon IV fluid bolus completion, place order for STAT BMET (LAB15) and call provider with results.   IV insulin  infusion with sufficient glucose should be continued until MD determines acidosis is corrected and places transition orders.   RN may order General Admission PRN Orders utilizing General Admission PRN medications (through manage orders) for the following patient needs: allergy symptoms (Claritin), cold sores (Carmex), cough (Robitussin DM), eye irritation (Liquifilm Tears), hemorrhoids (Tucks), indigestion (Maalox), minor skin irritation (Hydrocortisone Cream), muscle pain Lucienne Gay), nose irritation (saline nasal spray) and sore throat (Chloraseptic spray).  IV bolus already initiated   K+ > 5 mEq/L and/or K+ addressed separately   Patient Education:   Full code   Consult to hospitalist   Consult to diabetes coordinator   Consult to Registered Dietitian   Consult to Transition of Care Team   Consult to Registered Dietitian   CBG monitoring, ED   CBG monitoring, ED   I-Stat venous blood gas, ED   POC CBG, ED   CBG monitoring, ED   CBG monitoring, ED   CBG monitoring, ED   CBG monitoring, ED   ED EKG   EKG 12-Lead   ED EKG   EKG 12-Lead   Saline lock IV   Admit to Inpatient (patient's expected length of stay will be greater than 2 midnights or inpatient only procedure)   Aspiration precautions   Fall precautions     Author: Mario LULLA Blanch, MD 12 pm- 8 pm. [facility]. 09/04/2023 7:06 PM Please note for any communication after hours contact [facility] Assigned provider on call on Amion.

## 2023-09-04 NOTE — ED Provider Notes (Signed)
 [place] EMERGENCY DEPARTMENT AT [facility] Provider Note   CSN: [id number] Arrival date & time: 09/04/23  9040     Patient presents with: Weakness   Martha Valencia is a 48 y.o. female with PMH of IDA requiring iron supplementation. She presents today with a two week hx of fatigue. She reports that she feels dehydrated all day long and must drink water constantly otherwise she will not have any energy. She also reports feeling as if her mouth is dry constantly and increased urination. Over the last two months she endorsed heavier periods lasting 7-8 days instead of her typical 4. During that same time frame, she complains that she cannot walk short distances without becoming increasingly short of breath. She reports that when doing chores around the house, she must take frequent breaks. She denies chest pain, cough, N/V, abd pain, melena, hematochezia, hematuria, or peripheral edema. She called her PCP today who instructed her to come to the ED.        Prior to Admission medications   Medication Sig Start Date End Date Taking? Authorizing Provider  aspirin-acetaminophen -caffeine (EXCEDRIN MIGRAINE) 250-250-65 MG tablet Take 1 tablet by mouth daily as needed for headache.   Yes [provider]  ferrous sulfate  325 (65 FE) MG tablet Take 1 tablet (325 mg total) by mouth daily. 09/16/21  Yes McElwee, Lauren A, NP    Allergies: Patient has no known allergies.    Review of Systems  Constitutional:  Positive for activity change. Negative for chills and fever.  Respiratory:  Positive for shortness of breath. Negative for chest tightness and wheezing.   Cardiovascular:  Negative for chest pain, palpitations and leg swelling.  Gastrointestinal:  Negative for abdominal pain, anal bleeding, blood in stool, nausea and vomiting.  Endocrine: Positive for polydipsia and polyuria.  Neurological:  Positive for weakness.    Updated Vital Signs BP (!) 142/78   Pulse (!) 107    Temp 98.2 F (36.8 C) (Oral)   Resp (!) 23   Ht 5' 4 (1.626 m)   LMP 08/28/2023   SpO2 100%   BMI 32.79 kg/m   Physical Exam HENT:     Head: Normocephalic and atraumatic.  Cardiovascular:     Rate and Rhythm: Regular rhythm. Tachycardia present.     Heart sounds: Normal heart sounds.  Pulmonary:     Effort: Pulmonary effort is normal. No respiratory distress.     Breath sounds: Normal breath sounds.  Chest:     Chest wall: No tenderness.  Abdominal:     General: Bowel sounds are normal. There is no distension.     Palpations: Abdomen is soft.     Tenderness: There is no abdominal tenderness. There is no guarding.  Skin:    General: Skin is warm and dry.  Neurological:     Mental Status: She is alert.     (all labs ordered are listed, but only abnormal results are displayed) Labs Reviewed  URINALYSIS, ROUTINE W REFLEX MICROSCOPIC - Abnormal; Notable for the following components:      Result Value   APPearance CLOUDY (*)    Specific Gravity, Urine 1.036 (*)    Glucose, UA >=500 (*)    Hgb urine dipstick LARGE (*)    Ketones, ur 80 (*)    Protein, ur 100 (*)    Leukocytes,Ua MODERATE (*)    Bacteria, UA FEW (*)    All other components within normal limits  HEMOGLOBIN A1C - Abnormal; Notable  for the following components:   Hgb A1c MFr Bld 11.8 (*)    All other components within normal limits  COMPREHENSIVE METABOLIC PANEL WITH GFR - Abnormal; Notable for the following components:   Potassium 3.2 (*)    CO2 17 (*)    Glucose, Bld 445 (*)    AST 14 (*)    Anion gap 16 (*)    All other components within normal limits  BETA-HYDROXYBUTYRIC ACID - Abnormal; Notable for the following components:   Beta-Hydroxybutyric Acid 4.48 (*)    All other components within normal limits  CBG MONITORING, ED - Abnormal; Notable for the following components:   Glucose-Capillary 457 (*)    All other components within normal limits  CBG MONITORING, ED - Abnormal; Notable for the  following components:   Glucose-Capillary 356 (*)    All other components within normal limits  I-STAT VENOUS BLOOD GAS, ED - Abnormal; Notable for the following components:   pCO2, Ven 29.8 (*)    pO2, Ven 68 (*)    Bicarbonate 15.6 (*)    TCO2 17 (*)    Acid-base deficit 9.0 (*)    All other components within normal limits  CBG MONITORING, ED - Abnormal; Notable for the following components:   Glucose-Capillary 308 (*)    All other components within normal limits  URINE CULTURE  CULTURE, BLOOD (ROUTINE X 2)  CULTURE, BLOOD (ROUTINE X 2)  CBC  HCG, SERUM, QUALITATIVE  MAGNESIUM     EKG: None  Radiology: DG Chest Portable 1 View Result Date: 09/04/2023 CLINICAL DATA:  Edema. EXAM: PORTABLE CHEST 1 VIEW COMPARISON:  Chest x-ray 07/31/2021 FINDINGS: Underinflation. No consolidation, pneumothorax or effusion. No edema. Normal cardiopericardial silhouette. IMPRESSION: Underinflation.  No acute cardiopulmonary disease. Electronically Signed   By: Ranell Bring M.D.   On: 09/04/2023 12:44    Procedures   Medications Ordered in the ED  cefTRIAXone  (ROCEPHIN ) 1 g in sodium chloride  0.9 % 100 mL IVPB (has no administration in time range)  sodium chloride  0.9 % bolus 1,000 mL (1,000 mLs Intravenous New Bag/Given 09/04/23 1157)  magnesium  sulfate IVPB 2 g 50 mL (0 g Intravenous Stopped 09/04/23 1243)  lactated ringers  bolus 1,000 mL (1,000 mLs Intravenous New Bag/Given 09/04/23 1323)  potassium chloride  SA (KLOR-CON  M) CR tablet 40 mEq (40 mEq Oral Given 09/04/23 1317)  potassium chloride  10 mEq in 100 mL IVPB (0 mEq Intravenous Stopped 09/04/23 1457)  insulin  aspart (novoLOG ) injection 10 Units (10 Units Subcutaneous Given 09/04/23 1317)                                  Medical Decision Making Amount and/or Complexity of Data Reviewed Labs: ordered. Radiology: ordered.  Risk Prescription drug management.   Fatigue EKG with tachycardia and prolonged Qtc of 593. Will check Mg and  electrolytes. Replete if necessary UA with >500 glucose, ketones 80 CMP with glucose 445, K 3.2, serum bicarb 17, AG 16 A1C 11.8 VBG pH 7.32 pCO2 29.8  Pt likely in DKA with respiratory compensation. Initiated LR bolus with potassium repletion and 10units subcutaneous insulin . CBG recheck 308. Beta hydroxybutyrate pending Hospitalist consulted for admission due to new diagnosis of diabetes and DKA.  UTI UA with moderate leukocytes, positive Hgb, and >50 RBC's with few bacteria. Pt is symptomatic at time of evaluation. Urine sent for culture. Will empirically treat with rocephin . D   Final diagnoses:  Dehydration  Diabetes  mellitus, new onset [facility])    ED Discharge Orders     None          Myrna Bitters, DO 09/04/23 1517    Pamella Sharper A, DO 09/16/23 1629

## 2023-09-04 NOTE — Telephone Encounter (Signed)
 Noted. Patient went to ED as advised by nurse triage.

## 2023-09-04 NOTE — Inpatient Diabetes Management (Signed)
 Inpatient Diabetes Program Recommendations  AACE/ADA: New Consensus Statement on Inpatient Glycemic Control  Target Ranges:  Prepandial:   less than 140 mg/dL      Peak postprandial:   less than 180 mg/dL (1-2 hours)      Critically ill patients:  140 - 180 mg/dL    Latest Reference Range & Units 09/04/23 11:56 09/04/23 13:29 09/04/23 14:24 09/04/23 15:27  Glucose-Capillary 70 - 99 mg/dL 542 (H) 643 (H) 691 (H) 239 (H)    Latest Reference Range & Units 09/04/23 11:30 09/04/23 13:25  Beta-Hydroxybutyric Acid 0.05 - 0.27 mmol/L  4.48 (H)  Glucose 70 - 99 mg/dL 554 (H)   Hemoglobin J8R 4.8 - 5.6 % 11.8 (H)    Review of Glycemic Control  Diabetes history: No Outpatient Diabetes medications: NA Current orders for Inpatient glycemic control: IV insulin   Inpatient Diabetes Program Recommendations:    HbgA1C: A1C 11.8% on 09/04/23 indicating an average glucose of 292 mg/dl over the past 2-3 months.  NOTE: Spoke with patient and spouse at bedside in ED about new diabetes diagnosis.  Patient reports that she has no prior DM hx, no family hx of DM, and when she had labs done about 1 year ago her labs were normal.   Patient states she has been having symptoms of hyperglycemia for 3-4 weeks.  Discussed initial glucose of 445 mg/dl and J8R results (88.1% on 09/04/23) and explained what an A1C is and informed patient that  current A1C indicates an average glucose of 292 mg/dl over the past 2-3 months. Discussed basic pathophysiology of DM Type 2, basic home care, importance of checking CBGs and maintaining good CBG control to prevent long-term and short-term complications. Reviewed glucose and A1C goals.  Discussed impact of nutrition, exercise, stress, sickness, and medications on diabetes control. Informed patient that a Living Well with diabetes booklet was ordered and asked that she read through entire book once received. Informed patient that our team will follow up with her tomorrow regarding new DM  dx and further DM education.  Patient verbalized understanding of information discussed and has no further questions at this time.   RNs to provide ongoing basic DM education at bedside with this patient and engage patient to actively check blood glucose and administer insulin  injections.  Ordered Living Well with DM book, consult RD for diet education, patient education by bedside nursing, and insulin  starter kit.   Thanks, Earnie Gainer, RN, MSN, CDE Diabetes Coordinator Inpatient Diabetes Program [phone] (Team Pager from 8am to 5pm)

## 2023-09-04 NOTE — Progress Notes (Signed)
 Patient arrives to [id number] at this time from the ED

## 2023-09-04 NOTE — ED Triage Notes (Signed)
 Pt arrives complaining of generalized weakness for over a week. States she had her period a week ago and it was heavier than normal. She also feels dehydrated even after drinking fluids. Denies pain on arrival

## 2023-09-04 NOTE — Telephone Encounter (Signed)
     FYI Only or Action Required?: FYI only for provider.  Patient was last seen in primary care on 10/22/2021 by Nedra Tinnie LABOR, NP.  Called Nurse Triage reporting Fatigue.  Symptoms began 3-4 weeks ago but worse lately.  Interventions attempted: Rest, hydration, or home remedies and Other: drinking a  lot of water.  Symptoms are: gradually worsening.  Triage Disposition: Go to ED Now (or PCP Triage)  Patient/caregiver understands and will follow disposition?: Yes                Copied from CRM [id number]. Topic: Clinical - Red Word Triage >> Sep 04, 2023  8:56 AM [place] E wrote: [facility] that prompted transfer to Nurse Triage: Possible dehydration, patient stating that she is drinking a lot of water but feeling fatigued and having some body weakness. Symptoms going on for 4 weeks. Reason for Disposition  Patient sounds very sick or weak to the triager    Patient states severe weakness/fatigue and recent menstrual periods that have been unusually very heavy, history of anemia and feeling like her heart is beating harder and faster (palpitations/tachycardia)  Answer Assessment - Initial Assessment Questions Patient states that her periods for the past 3 times have been so heavy much heavier than normal and she states after brief amounts of exertion she has to rest and is very tired and very weak She states she has had anemia in the past, takes iron, but doesn't take any other new medications She states that she doesn't know what is wrong but she knows something isn't right She states that she can feel her heart beating fast and she states she can check it and it will be around 95 or 97 it changes a lot she states but she states that she can feel it beating (possibly palpitations) She is advised that the Emergency Room is recommended at this time She states that her husband will take her to the Emergency Room at this time She is advised that at any point she can call  [phone] if things gets worse Patient verbalized understanding           1. DESCRIPTION: Describe how you are feeling.     Very tired   especially after exertion she feels like she will have to sleep 2. SEVERITY: How bad is it?  Can you stand and walk?     ------- 3. ONSET: When did these symptoms begin? (e.g., hours, days, weeks, months)     3-4 weeks 4. CAUSE: What do you think is causing the weakness or fatigue? (e.g., not drinking enough fluids, medical problem, trouble sleeping)     Drinking a lot of water, dry mouth 5. NEW MEDICINES:  Have you started on any new medicines recently? (e.g., opioid pain medicines, benzodiazepines, muscle relaxants, antidepressants, antihistamines, neuroleptics, beta blockers)     Nothing new 6. OTHER SYMPTOMS: Do you have any other symptoms? (e.g., chest pain, fever, cough, SOB, vomiting, diarrhea, bleeding, other areas of pain)     tachycardia 7. PREGNANCY: Is there any chance you are pregnant? When was your last menstrual period?     Last week  Protocols used: Weakness (Generalized) and Fatigue-A-AH

## 2023-09-04 NOTE — ED Notes (Signed)
BG [phone]

## 2023-09-05 DIAGNOSIS — R531 Weakness: Secondary | ICD-10-CM | POA: Diagnosis not present

## 2023-09-05 LAB — BASIC METABOLIC PANEL WITH GFR
Anion gap: 7 (ref 5–15)
Anion gap: 9 (ref 5–15)
BUN: 7 mg/dL (ref 6–20)
BUN: 6 mg/dL (ref 6–20)
CO2: 19 mmol/L — ABNORMAL LOW (ref 22–32)
CO2: 19 mmol/L — ABNORMAL LOW (ref 22–32)
Calcium: 7.9 mg/dL — ABNORMAL LOW (ref 8.9–10.3)
Calcium: 8.2 mg/dL — ABNORMAL LOW (ref 8.9–10.3)
Chloride: 112 mmol/L — ABNORMAL HIGH (ref 98–111)
Chloride: 107 mmol/L (ref 98–111)
Creatinine, Ser: 0.33 mg/dL — ABNORMAL LOW (ref 0.44–1.00)
Creatinine, Ser: 0.42 mg/dL — ABNORMAL LOW (ref 0.44–1.00)
GFR, Estimated: >60 mL/min (ref >60)
GFR, Estimated: >60 mL/min (ref >60)
Glucose, Bld: 123 mg/dL — ABNORMAL HIGH (ref 70–99)
Glucose, Bld: 237 mg/dL — ABNORMAL HIGH (ref 70–99)
Potassium: 3.1 mmol/L — ABNORMAL LOW (ref 3.5–5.1)
Potassium: 3 mmol/L — ABNORMAL LOW (ref 3.5–5.1)
Sodium: 138 mmol/L (ref 135–145)
Sodium: 135 mmol/L (ref 135–145)

## 2023-09-05 LAB — CBC WITH DIFFERENTIAL/PLATELET
Abs Immature Granulocytes: 0.02 K/uL (ref 0.00–0.07)
Basophils Absolute: 0.1 K/uL (ref 0.0–0.1)
Basophils Relative: 1 %
Eosinophils Absolute: 0.2 K/uL (ref 0.0–0.5)
Eosinophils Relative: 3 %
HCT: 36.1 % (ref 36.0–46.0)
Hemoglobin: 12.7 g/dL (ref 12.0–15.0)
Immature Granulocytes: 0 %
Lymphocytes Relative: 28 %
Lymphs Abs: 1.7 K/uL (ref 0.7–4.0)
MCH: 29.7 pg (ref 26.0–34.0)
MCHC: 35.2 g/dL (ref 30.0–36.0)
MCV: 84.5 fL (ref 80.0–100.0)
Monocytes Absolute: 0.4 K/uL (ref 0.1–1.0)
Monocytes Relative: 6 %
Neutro Abs: 3.9 K/uL (ref 1.7–7.7)
Neutrophils Relative %: 62 %
Platelets: 297 K/uL (ref 150–400)
RBC: 4.27 MIL/uL (ref 3.87–5.11)
RDW: 13.3 % (ref 11.5–15.5)
WBC: 6.2 K/uL (ref 4.0–10.5)
nRBC: 0 % (ref 0.0–0.2)

## 2023-09-05 LAB — GLUCOSE, CAPILLARY
Glucose-Capillary: 110 mg/dL — ABNORMAL HIGH (ref 70–99)
Glucose-Capillary: 125 mg/dL — ABNORMAL HIGH (ref 70–99)
Glucose-Capillary: 159 mg/dL — ABNORMAL HIGH (ref 70–99)
Glucose-Capillary: 174 mg/dL — ABNORMAL HIGH (ref 70–99)
Glucose-Capillary: 187 mg/dL — ABNORMAL HIGH (ref 70–99)
Glucose-Capillary: 194 mg/dL — ABNORMAL HIGH (ref 70–99)
Glucose-Capillary: 247 mg/dL — ABNORMAL HIGH (ref 70–99)
Glucose-Capillary: 266 mg/dL — ABNORMAL HIGH (ref 70–99)
Glucose-Capillary: 269 mg/dL — ABNORMAL HIGH (ref 70–99)

## 2023-09-05 LAB — PROCALCITONIN: Procalcitonin: <0.1 ng/mL

## 2023-09-05 LAB — BETA-HYDROXYBUTYRIC ACID: Beta-Hydroxybutyric Acid: 0.88 mmol/L — ABNORMAL HIGH (ref 0.05–0.27)

## 2023-09-05 LAB — PHOSPHORUS: Phosphorus: 2.5 mg/dL (ref 2.5–4.6)

## 2023-09-05 LAB — MAGNESIUM: Magnesium: 1.7 mg/dL (ref 1.7–2.4)

## 2023-09-05 LAB — C-REACTIVE PROTEIN: CRP: <0.5 mg/dL (ref <1.0)

## 2023-09-05 MED ORDER — LACTATED RINGERS IV SOLN: INTRAVENOUS | Status: AC

## 2023-09-05 MED ORDER — THIAMINE MONONITRATE 100 MG PO TABS
100.0000 mg | ORAL_TABLET | Freq: Every day | ORAL | Status: DC
  Administered 2023-09-05 – 2023-09-06 (×2): 100 mg via ORAL
  Filled 2023-09-05 (×2): qty 1

## 2023-09-05 MED ORDER — INSULIN ASPART 100 UNIT/ML IJ SOLN
0.0000 [IU] | Freq: Three times a day (TID) | INTRAMUSCULAR | Status: DC
  Administered 2023-09-05: 3 [IU] via SUBCUTANEOUS
  Administered 2023-09-05: 5 [IU] via SUBCUTANEOUS
  Administered 2023-09-05: 2 [IU] via SUBCUTANEOUS
  Administered 2023-09-06 (×2): 3 [IU] via SUBCUTANEOUS

## 2023-09-05 MED ORDER — MAGNESIUM SULFATE 2 GM/50ML IV SOLN
2.0000 g | Freq: Once | INTRAVENOUS | Status: AC
  Administered 2023-09-05: 2 g via INTRAVENOUS
  Filled 2023-09-05: qty 50

## 2023-09-05 MED ORDER — INSULIN ASPART 100 UNIT/ML IJ SOLN
0.0000 [IU] | Freq: Every day | INTRAMUSCULAR | Status: DC
  Administered 2023-09-05: 3 [IU] via SUBCUTANEOUS

## 2023-09-05 MED ORDER — ADULT MULTIVITAMIN W/MINERALS CH
1.0000 | ORAL_TABLET | Freq: Every day | ORAL | Status: DC
  Administered 2023-09-05 – 2023-09-06 (×2): 1 via ORAL
  Filled 2023-09-05 (×2): qty 1

## 2023-09-05 MED ORDER — INSULIN GLARGINE-YFGN 100 UNIT/ML ~~LOC~~ SOLN
20.0000 [IU] | Freq: Every day | SUBCUTANEOUS | Status: DC
  Administered 2023-09-05 (×2): 20 [IU] via SUBCUTANEOUS
  Filled 2023-09-05 (×3): qty 0.2

## 2023-09-05 MED ORDER — POTASSIUM CHLORIDE CRYS ER 20 MEQ PO TBCR
40.0000 meq | EXTENDED_RELEASE_TABLET | Freq: Two times a day (BID) | ORAL | Status: AC
  Administered 2023-09-05 (×2): 40 meq via ORAL
  Filled 2023-09-05 (×2): qty 2

## 2023-09-05 NOTE — TOC Initial Note (Signed)
 Transition of Care [facility]) - Initial/Assessment Note    Patient Details  Name: Martha Valencia MRN: [id number] Date of Birth: 1975-04-28  Transition of [facility]) CM/SW Contact:    Martha Gell, RN Phone Number: 09/05/2023, 8:32 AM  Clinical Narrative:                 Per chart review  Patient admitted with DKA. From home with spouse.  No current PCP care, last went to IM clinic, requested CMA to reschedule with them.  Patient has Medicaid for prescription drug coverage.   Expected Discharge Plan: Home/Self Care Barriers to Discharge: Continued Medical Work up   Patient Goals and CMS Choice            Expected Discharge Plan and Services   Discharge Planning Services: CM Consult   Living arrangements for the past 2 months: Apartment                                      Prior Living Arrangements/Services Living arrangements for the past 2 months: Apartment Lives with:: Spouse                   Activities of Daily Living   ADL Screening (condition at time of admission) Independently performs ADLs?: Yes (appropriate for developmental age) Is the patient deaf or have difficulty hearing?: No Does the patient have difficulty seeing, even when wearing glasses/contacts?: No Does the patient have difficulty concentrating, remembering, or making decisions?: No  Permission Sought/Granted                  Emotional Assessment              Admission diagnosis:  Dehydration [E86.0] Generalized weakness [R53.1] Diabetes mellitus, new onset (HCC) [E11.9] Patient Active Problem List   Diagnosis Date Noted   Generalized weakness 09/04/2023   Obesity (BMI 30-39.9) 10/22/2021   Acute left-sided thoracic back pain 09/15/2021   Anemia 08/12/2021   Dry mouth 08/12/2021   PCP:  Martha Valencia LABOR, NP Pharmacy:   [id number] [place], [name] - [address] [place] [address] Phone: [phone] Fax:  [phone]     Social Drivers of Health (SDOH) Social History: SDOH Screenings   Food Insecurity: No Food Insecurity (09/04/2023)  Housing: Low Risk  (09/04/2023)  Transportation Needs: No Transportation Needs (09/04/2023)  Utilities: Not At Risk (09/04/2023)  Depression (PHQ2-9): Low Risk  (10/22/2021)  Tobacco Use: Low Risk  (09/04/2023)   SDOH Interventions:     Readmission Risk Interventions     No data to display

## 2023-09-05 NOTE — Plan of Care (Signed)

## 2023-09-05 NOTE — Progress Notes (Signed)
 PROGRESS NOTE                                                                                                                                                                                                             Patient Demographics:    Martha Valencia, is a 48 y.o. female, DOB - Aug 04, 1975, FMW:[id number]  Outpatient Primary MD for the patient is Nedra Tinnie LABOR, NP    LOS - 1  Admit date - 09/04/2023    Chief Complaint  Patient presents with   Weakness       Brief Narrative (HPI from H&P)   48 y.o. female with past medical history  of  Htn coming for  Weakness 1 month, dizziness, lack of energy and also reports polyuria and polydipsia and yeast infection. Weight is 86.6 kg.no weight changes. Pt also has c/o dysuria.  No other complaints of fevers chills flank pain hematuria.  Back pain nausea vomiting chest pain palpitations.    Subjective:    Antone Caller today has, No headache, No chest pain, No abdominal pain - No Nausea, No new weakness tingling or numbness, shortness of breath   Assessment  & Plan :   DKA in a patient with newly diagnosed DM type II.  Was treated with DKA protocol, DKA has resolved, transition to subcu insulin , continue gentle hydration with IV fluids as she still appears dehydrated clinically, diabetic and insulin  education today.  Lab Results  Component Value Date   HGBA1C 11.8 (H) 09/04/2023   CBG (last 3)  Recent Labs    09/05/23 0428 09/05/23 0509 09/05/23 0803  GLUCAP 174* 187* 247*    Sepsis with UTI present on admission.  Sepsis pathophysiology has resolved, has been adequately hydrated, gentle IV fluids to continue on 09/05/2023, on Rocephin , follow cultures.  Hypokalemia.  Replaced.      Condition - Fair  Family Communication  : Husband bedside on 09/05/2023  Code Status : Full code  Consults  : Diabetes educator  PUD Prophylaxis :     Procedures  :             Disposition Plan  :    Status is: Inpatient   DVT Prophylaxis  :    heparin  injection 5,000 Units Start: 09/04/23 1530    Lab Results  Component Value Date  PLT 297 09/05/2023    Diet :  Diet Order             Diet Carb Modified Fluid consistency: Thin; Room service appropriate? Yes  Diet effective now                    Inpatient Medications  Scheduled Meds:  heparin   5,000 Units Subcutaneous Q8H   insulin  aspart  0-5 Units Subcutaneous QHS   insulin  aspart  0-9 Units Subcutaneous TID WC   insulin  glargine-yfgn  20 Units Subcutaneous QHS   insulin  starter kit- pen needles  1 kit Other Once   living well with diabetes book   Does not apply Once   potassium chloride   40 mEq Oral BID   Continuous Infusions:  cefTRIAXone  (ROCEPHIN )  IV Stopped (09/04/23 1826)   insulin  3.4 Units/hr (09/05/23 0257)   lactated ringers  75 mL/hr at 09/05/23 0811   magnesium  sulfate bolus IVPB     PRN Meds:.dextrose      Objective:   Vitals:   09/05/23 0000 09/05/23 0400 09/05/23 0500 09/05/23 0800  BP:    111/76  Pulse:      Resp:   18   Temp: 98 F (36.7 C) 98 F (36.7 C)  (!) 97.4 F (36.3 C)  TempSrc: Oral Oral  Oral  SpO2:      Weight:      Height:        Wt Readings from Last 3 Encounters:  09/04/23 82.2 kg  10/22/21 86.6 kg  09/15/21 86.5 kg     Intake/Output Summary (Last 24 hours) at 09/05/2023 0918 Last data filed at 09/05/2023 0600 Gross per 24 hour  Intake 0 ml  Output --  Net 0 ml     Physical Exam  Awake Alert, No new F.N deficits, Normal affect [place].AT,PERRAL Supple Neck, No JVD,   Symmetrical Chest wall movement, Good air movement bilaterally, CTAB RRR,No Gallops,Rubs or new Murmurs,  +ve B.Sounds, Abd Soft, No tenderness,   No Cyanosis, Clubbing or edema      Data Review:    Recent Labs  Lab 09/04/23 1022 09/04/23 1336 09/05/23 0640  WBC 7.7  --  6.2  HGB 14.1 13.3 12.7  HCT 41.0 39.0 36.1  PLT 359  --  297  MCV  86.7  --  84.5  MCH 29.8  --  29.7  MCHC 34.4  --  35.2  RDW 13.1  --  13.3  LYMPHSABS  --   --  1.7  MONOABS  --   --  0.4  EOSABS  --   --  0.2  BASOSABS  --   --  0.1    Recent Labs  Lab 09/04/23 1130 09/04/23 1336 09/04/23 1534 09/04/23 1929 09/04/23 2326 09/05/23 0640  NA 136 139 133* 138 138 135  K 3.2* 3.5 3.4* 3.1* 3.1* 3.0*  CL 103  --  108 111 112* 107  CO2 17*  --  15* 17* 19* 19*  ANIONGAP 16*  --  10 10 7 9   GLUCOSE 445*  --  232* 104* 123* 237*  BUN 6  --  7 7 7 6   CREATININE 0.61  --  0.43* 0.37* 0.33* 0.42*  AST 14*  --   --   --   --   --   ALT 11  --   --   --   --   --   ALKPHOS 80  --   --   --   --   --  BILITOT 1.1  --   --   --   --   --   ALBUMIN 3.8  --   --   --   --   --   CRP  --   --   --   --   --  <0.5  PROCALCITON  --   --   --   --   --  <0.10  HGBA1C 11.8*  --   --   --   --   --   MG 1.7  --   --   --   --  1.7  PHOS  --   --   --   --   --  2.5  CALCIUM 9.0  --  8.1* 7.9* 7.9* 8.2*      Recent Labs  Lab 09/04/23 1130 09/04/23 1534 09/04/23 1929 09/04/23 2326 09/05/23 0640  CRP  --   --   --   --  <0.5  PROCALCITON  --   --   --   --  <0.10  HGBA1C 11.8*  --   --   --   --   MG 1.7  --   --   --  1.7  CALCIUM 9.0 8.1* 7.9* 7.9* 8.2*    --------------------------------------------------------------------------------------------------------------- Lab Results  Component Value Date   CHOL 187 10/22/2021   HDL 65.10 10/22/2021   LDLCALC 109 (H) 10/22/2021   TRIG 64.0 10/22/2021   CHOLHDL 3 10/22/2021    Lab Results  Component Value Date   HGBA1C 11.8 (H) 09/04/2023   No results for input(s): TSH, T4TOTAL, FREET4, T3FREE, THYROIDAB in the last 72 hours. No results for input(s): VITAMINB12, FOLATE, FERRITIN, TIBC, IRON, RETICCTPCT in the last 72 hours. ------------------------------------------------------------------------------------------------------------------ Cardiac Enzymes No results  for input(s): CKMB, TROPONINI, MYOGLOBIN in the last 168 hours.  Invalid input(s): CK  Micro Results Recent Results (from the past 240 hours)  Culture, blood (Routine X 2) w Reflex to ID Panel     Status: None (Preliminary result)   Collection Time: 09/04/23  3:32 PM   Specimen: BLOOD  Result Value Ref Range Status   Specimen Description BLOOD LEFT ANTECUBITAL  Final   Special Requests   Final    BOTTLES DRAWN AEROBIC AND ANAEROBIC Blood Culture results may not be optimal due to an inadequate volume of blood received in culture bottles   Culture   Final    NO GROWTH < 24 HOURS Performed at [facility], [address] [place], [place] [address]    Report Status PENDING  Incomplete  Culture, blood (Routine X 2) w Reflex to ID Panel     Status: None (Preliminary result)   Collection Time: 09/04/23  3:34 PM   Specimen: BLOOD  Result Value Ref Range Status   Specimen Description BLOOD LEFT ANTECUBITAL  Final   Special Requests   Final    BOTTLES DRAWN AEROBIC AND ANAEROBIC Blood Culture results may not be optimal due to an inadequate volume of blood received in culture bottles   Culture   Final    NO GROWTH < 24 HOURS Performed at [facility], [address], [place], [place] [address]    Report Status PENDING  Incomplete  MRSA Next Gen by PCR, Nasal     Status: None   Collection Time: 09/04/23  9:47 PM   Specimen: Nasal Mucosa; Nasal Swab  Result Value Ref Range Status   MRSA by PCR Next Gen NOT DETECTED  NOT DETECTED Final    Comment: (NOTE) The GeneXpert MRSA Assay (FDA approved for NASAL specimens only), is one component of a comprehensive MRSA colonization surveillance program. It is not intended to diagnose MRSA infection nor to guide or monitor treatment for MRSA infections. Test performance is not FDA approved in patients less than 51 years old. Performed at [facility], [address] [place], [place] [address]     Radiology Report DG  Chest Portable 1 View Result Date: 09/04/2023 CLINICAL DATA:  Edema. EXAM: PORTABLE CHEST 1 VIEW COMPARISON:  Chest x-ray 07/31/2021 FINDINGS: Underinflation. No consolidation, pneumothorax or effusion. No edema. Normal cardiopericardial silhouette. IMPRESSION: Underinflation.  No acute cardiopulmonary disease. Electronically Signed   By: Ranell Bring M.D.   On: 09/04/2023 12:44     Signature  -   Lavada Stank M.D on 09/05/2023 at 9:18 AM   -  To page go to [email or web address]

## 2023-09-05 NOTE — Progress Notes (Signed)
 Initial Nutrition Assessment  DOCUMENTATION CODES:   Obesity unspecified  INTERVENTION:  Diabetes education completed  Encouraged adequate PO intake  Encouraged reaching out to RD with any further questions  NUTRITION DIAGNOSIS:   Food and nutrition related knowledge deficit related to limited prior education (new onset type 2 diabetes) as evidenced by other (comment) (A1c 11.8%).  GOAL:   Patient will meet greater than or equal to 90% of their needs  MONITOR:   PO intake  REASON FOR ASSESSMENT:   Consult Assessment of nutrition requirement/status, Diet education  ASSESSMENT:   Pt with hx of HTN and anemia. Admitted with polyuria, polydipsia, fatigue, weakness, and n/v, diagnosed with DKA and new onset type 2 diabetes.  Pt requiring potassium and magnesium  repletion, suspect pt is refeeding due to previous dextrose  5% infusion. Recommend continuing to monitor electrolyte labs until stable and ordered MVI w/ minerals and thiamine  100mg  daily. Conducted full assessment with education to investigate for possible malnutrition.  RD consulted for nutrition education regarding diabetes.   Lab Results  Component Value Date   HGBA1C 11.8 (H) 09/04/2023    RD provided Plate Method for Diabetes handout from the Academy of [employer] Dietetics. Discussed different food groups and their effects on blood sugar, emphasizing carbohydrate-containing foods. Provided list of carbohydrates and recommended serving sizes of common foods.  Pt reported she would eat 3x per day PTA. Pt reports eating most meals at home and cooking her meals. Pt reports she would eat variety of foods including yogurt, rice, eggs, beef, lamb, chicken, most green vegetables, fresh fruits and fresh made fruit juices. Pt reports she does not add any additional sugar to her homemade juice and typically includes vegetables in the juice as well. Pt reports she was shocked about diabetes diagnosis because she had  been to the doctor and had blood work done about 6 months ago. Reassured pt that diagnosis does not mean she was doing anything wrong and encouraged pt to continue healthy habits at home. Pt reports no appetite issues at this time. Pt reports that she does go through periods of fasting according to her religious beliefs and was concerned how diagnosis would affect her. Discussed importance of speaking with PCP about upcoming fast periods so they can manage any medications she is prescribed and adjust if needed. Discussed signs/symptoms and dangers of hyperglycemia and hypoglycemia. Encouraged pt to watch for signs especially during fasting periods, pt agreeable.  Discussed importance of controlled and consistent carbohydrate intake throughout the day. Provided examples of ways to balance meals/snacks and encouraged intake of high-fiber, whole grain complex carbohydrates. Teach back method used.  Expect good compliance.  Nutrition focused physical exam shows no depletions, pt appears well nourished at baseline. Suspect refeeding was due to acute decreased intake over the last few days when pt was in ED/being admitted.  Current diet order is Carb modified, patient is consuming approximately 100% of meals at this time (self reported by pt, no diet summary documentation completed).   Medications reviewed and include:  SSI Novolog  TID SSI Novolog  TID w/ meals Semglee  20 units daily Potassium chloride  40mEq Ceftriaxone  Insulin  infusion Magnesium  sulfate IVPB 2 g  Labs reviewed:  Potassium 3.0<-- 3.5 Magnesium  1.7 Phosphorus 2.5 CBG x 24 hours: 88-457 mg/dL J8R 88.1  NUTRITION - FOCUSED PHYSICAL EXAM:  Flowsheet Row Most Recent Value  Orbital Region No depletion  Upper Arm Region No depletion  Thoracic and Lumbar Region No depletion  Buccal Region No depletion  Temple Region No  depletion  Clavicle Bone Region No depletion  Clavicle and Acromion Bone Region No depletion  Scapular Bone  Region No depletion  Dorsal Hand No depletion  Patellar Region No depletion  Anterior Thigh Region No depletion  Posterior Calf Region No depletion  Edema (RD Assessment) Mild  [generalized BLE BUE]  Hair Reviewed  Eyes Reviewed  Mouth Reviewed  Skin Reviewed  Nails Reviewed    Diet Order:   Diet Order             Diet Carb Modified Fluid consistency: Thin; Room service appropriate? Yes  Diet effective now                   EDUCATION NEEDS:   Education needs have been addressed  Skin:  Skin Assessment: Reviewed RN Assessment  Last BM:  PTA  Height:   Ht Readings from Last 1 Encounters:  09/04/23 5' 4 (1.626 m)    Weight:   Wt Readings from Last 1 Encounters:  09/04/23 82.2 kg    Ideal Body Weight:  54.5 kg  BMI:  Body mass index is 31.11 kg/m.  Estimated Nutritional Needs:   Kcal:  1400-1600  Protein:  60-75g  Fluid:  1.4-1.6L    Josette Glance, MS, RDN, LDN Clinical Dietitian I Please reach out via secure chat

## 2023-09-05 NOTE — Inpatient Diabetes Management (Signed)
 Inpatient Diabetes Program Recommendations  AACE/ADA: New Consensus Statement on Inpatient Glycemic Control (2015)  Target Ranges:  Prepandial:   less than 140 mg/dL      Peak postprandial:   less than 180 mg/dL (1-2 hours)      Critically ill patients:  140 - 180 mg/dL   Lab Results  Component Value Date   GLUCAP 247 (H) 09/05/2023   HGBA1C 11.8 (H) 09/04/2023    Review of Glycemic Control  Diabetes history: New DM2 Outpatient Diabetes medications: None Current orders for Inpatient glycemic control: Semglee  20 units every day, Novolog  0-9 units TID and 0-5 units QHS  DKA/New DM2  Met with patient at bedside.  Spoke with pt and sister about new diagnosis. Discussed A1C results with them and explained what an A1C is, basic pathophysiology of DM Type 2, basic home care, basic diabetes diet nutrition principles, importance of checking CBGs and maintaining good CBG control to prevent long-term and short-term complications. Reviewed signs and symptoms of hyperglycemia and hypoglycemia and how to treat hypoglycemia at home. Also reviewed blood sugar goals at home.  RNs to provide ongoing basic DM education at bedside with this patient. Have ordered educational booklet &  insulin  starter kit.   Educated patient on insulin  pen use at home. Reviewed contents of insulin  flexpen starter kit. Reviewed all steps of insulin  pen including attachment of needle, 2-unit air shot, dialing up dose, giving injection, removing needle, disposal of sharps, storage of unused insulin , disposal of insulin  etc. Patient able to provide successful return demonstration. Also reviewed troubleshooting with insulin  pen. MD to give patient Rxs for insulin  pens and insulin  pen needles.  Educated on The Plate Method, CHO's, portion control, avoiding caloric beverages, CBGs at home fasting and mid afternoon, F/U with PCP every 3 months, bring meter to PCP office, long and short term complications of uncontrolled BG, and  importance of exercise.  Reviewed hypoglycemia, < 70 mg/dL, signs, symptoms and treatments.   Thank you, Wyvonna Pinal, MSN, CDCES Diabetes Coordinator Inpatient Diabetes Program [phone] (team pager from 8a-5p)

## 2023-09-05 NOTE — Plan of Care (Signed)
 Pt has rested quietly throughout the night with no distress noted. Alert and oriented. ON room air. SR on the monitor. Up to BR  to void with assit X1. Insulin  drip and endo tool most of night. Semglee  given and drip d/c'd 2 hours later. No complaints voiced.     Problem: Metabolic: Goal: Ability to maintain appropriate glucose levels will improve Outcome: Progressing   Problem: Education: Goal: Knowledge of General Education information will improve Description: Including pain rating scale, medication(s)/side effects and non-pharmacologic comfort measures Outcome: Progressing   Problem: Clinical Measurements: Goal: Respiratory complications will improve Outcome: Progressing Goal: Cardiovascular complication will be avoided Outcome: Progressing   Problem: Pain Managment: Goal: General experience of comfort will improve and/or be controlled Outcome: Progressing

## 2023-09-05 NOTE — Evaluation (Signed)
 Physical Therapy Evaluation Patient Details Name: Martha Valencia MRN: [id number] DOB: [birth year] Today's Date: 09/05/2023  History of Present Illness  Pt is a 48 y.o. female admitted 7/28 with c/o weakness. Dx with new onset DM and DKA. PMH:  IDA  Clinical Impression  PT eval complete. Pt independent with all functional mobility. No further skilled PT intervention indicated. Will defer further mobility to MT if pt remains in hospital. PT signing off.        If plan is discharge home, recommend the following:     Can travel by private vehicle        Equipment Recommendations None recommended by PT  Recommendations for Other Services       Functional Status Assessment Patient has not had a recent decline in their functional status     Precautions / Restrictions Precautions Precautions: None Recall of Precautions/Restrictions: Intact      Mobility  Bed Mobility Overal bed mobility: Independent                  Transfers Overall transfer level: Independent                      Ambulation/Gait Ambulation/Gait assistance: Independent Gait Distance (Feet): 600 Feet Assistive device: None Gait Pattern/deviations: WFL(Within Functional Limits) Gait velocity: WNL Gait velocity interpretation: >4.37 ft/sec, indicative of normal walking speed   General Gait Details: steady gait  Stairs            Wheelchair Mobility     Tilt Bed    Modified Rankin (Stroke Patients Only)       Balance Overall balance assessment: Independent                                           Pertinent Vitals/Pain Pain Assessment Pain Assessment: No/denies pain    Home Living Family/patient expects to be discharged to:: Private residence Living Arrangements: Spouse/significant other Available Help at Discharge: Family Type of Home: Apartment Home Access: Stairs to enter Entrance Stairs-Rails: Right Entrance Stairs-Number of Steps: flight    Home Layout: One level Home Equipment: None      Prior Function Prior Level of Function : Independent/Modified Independent                     Extremity/Trunk Assessment   Upper Extremity Assessment Upper Extremity Assessment: Overall WFL for tasks assessed    Lower Extremity Assessment Lower Extremity Assessment: Overall WFL for tasks assessed    Cervical / Trunk Assessment Cervical / Trunk Assessment: Normal  Communication   Communication Communication: No apparent difficulties    Cognition Arousal: Alert Behavior During Therapy: WFL for tasks assessed/performed   PT - Cognitive impairments: No apparent impairments                         Following commands: Intact       Cueing Cueing Techniques: Verbal cues     General Comments General comments (skin integrity, edema, etc.): VSS on RA    Exercises     Assessment/Plan    PT Assessment Patient does not need any further PT services  PT Problem List         PT Treatment Interventions      PT Goals (Current goals can be found in the Care Plan section)  Acute Rehab  PT Goals Patient Stated Goal: home PT Goal Formulation: All assessment and education complete, DC therapy    Frequency       Co-evaluation               AM-PAC PT 6 Clicks Mobility  Outcome Measure Help needed turning from your back to your side while in a flat bed without using bedrails?: None Help needed moving from lying on your back to sitting on the side of a flat bed without using bedrails?: None Help needed moving to and from a bed to a chair (including a wheelchair)?: None Help needed standing up from a chair using your arms (e.g., wheelchair or bedside chair)?: None Help needed to walk in hospital room?: None Help needed climbing 3-5 steps with a railing? : A Little 6 Click Score: 23    End of Session   Activity Tolerance: Patient tolerated treatment well Patient left: in bed;with call bell/phone  within reach;with family/visitor present Nurse Communication: Mobility status PT Visit Diagnosis: Muscle weakness (generalized) (M62.81)    Time: 9254-9198 PT Time Calculation (min) (ACUTE ONLY): 16 min   Charges:   PT Evaluation $PT Eval Low Complexity: 1 Low   PT General Charges $$ ACUTE PT VISIT: 1 Visit         Sari MATSU., PT  Office # [phone]   Erven Sari Shaker 09/05/2023, 8:27 AM

## 2023-09-06 ENCOUNTER — Other Ambulatory Visit (HOSPITAL_COMMUNITY): Payer: Self-pay

## 2023-09-06 DIAGNOSIS — R531 Weakness: Secondary | ICD-10-CM | POA: Diagnosis not present

## 2023-09-06 LAB — GLUCOSE, CAPILLARY
Glucose-Capillary: 215 mg/dL — ABNORMAL HIGH (ref 70–99)
Glucose-Capillary: 226 mg/dL — ABNORMAL HIGH (ref 70–99)

## 2023-09-06 LAB — BASIC METABOLIC PANEL WITH GFR
Anion gap: 10 (ref 5–15)
BUN: 13 mg/dL (ref 6–20)
CO2: 21 mmol/L — ABNORMAL LOW (ref 22–32)
Calcium: 8.7 mg/dL — ABNORMAL LOW (ref 8.9–10.3)
Chloride: 107 mmol/L (ref 98–111)
Creatinine, Ser: 0.44 mg/dL (ref 0.44–1.00)
GFR, Estimated: >60 mL/min (ref >60)
Glucose, Bld: 186 mg/dL — ABNORMAL HIGH (ref 70–99)
Potassium: 3.8 mmol/L (ref 3.5–5.1)
Sodium: 138 mmol/L (ref 135–145)

## 2023-09-06 LAB — MAGNESIUM: Magnesium: 1.6 mg/dL — ABNORMAL LOW (ref 1.7–2.4)

## 2023-09-06 LAB — PHOSPHORUS: Phosphorus: 3.2 mg/dL (ref 2.5–4.6)

## 2023-09-06 MED ORDER — INSULIN GLARGINE-YFGN 100 UNIT/ML ~~LOC~~ SOLN
22.0000 [IU] | Freq: Every day | SUBCUTANEOUS | Status: DC
  Filled 2023-09-06: qty 0.22

## 2023-09-06 MED ORDER — BLOOD GLUCOSE TEST VI STRP
1.0000 | ORAL_STRIP | Freq: Three times a day (TID) | 0 refills | Status: AC
  Filled 2023-09-06: qty 100, 34d supply, fill #0

## 2023-09-06 MED ORDER — MAGNESIUM SULFATE 2 GM/50ML IV SOLN
2.0000 g | Freq: Once | INTRAVENOUS | Status: AC
  Administered 2023-09-06: 2 g via INTRAVENOUS
  Filled 2023-09-06: qty 50

## 2023-09-06 MED ORDER — INSULIN PEN NEEDLE 32G X 4 MM MISC
0 refills | Status: DC
  Filled 2023-09-06: qty 100, 25d supply, fill #0

## 2023-09-06 MED ORDER — INSULIN ASPART 100 UNIT/ML FLEXPEN
PEN_INJECTOR | SUBCUTANEOUS | 0 refills | Status: DC
  Filled 2023-09-06: qty 15, 50d supply, fill #0

## 2023-09-06 MED ORDER — CEPHALEXIN 500 MG PO CAPS
500.0000 mg | ORAL_CAPSULE | Freq: Three times a day (TID) | ORAL | 0 refills | Status: AC
  Filled 2023-09-06: qty 9, 3d supply, fill #0

## 2023-09-06 MED ORDER — ACCU-CHEK SOFTCLIX LANCETS MISC
1.0000 | Freq: Three times a day (TID) | 0 refills | Status: DC
Start: 2023-09-06 — End: 2023-09-26
  Filled 2023-09-06: qty 100, 30d supply, fill #0

## 2023-09-06 MED ORDER — TECHLITE PEN NEEDLES 32G X 4 MM MISC
0 refills | Status: DC
  Filled 2023-09-06: qty 100, 25d supply, fill #0

## 2023-09-06 MED ORDER — LANCET DEVICE MISC
1.0000 | Freq: Three times a day (TID) | 0 refills | Status: AC
  Filled 2023-09-06: qty 1, 30d supply, fill #0

## 2023-09-06 MED ORDER — BLOOD GLUCOSE MONITOR SYSTEM W/DEVICE KIT
1.0000 | PACK | Freq: Three times a day (TID) | 0 refills | Status: AC
Start: 2023-09-06 — End: ?
  Filled 2023-09-06: qty 1, 30d supply, fill #0

## 2023-09-06 MED ORDER — INSULIN GLARGINE 100 UNIT/ML SOLOSTAR PEN
22.0000 [IU] | PEN_INJECTOR | Freq: Every day | SUBCUTANEOUS | 0 refills | Status: DC
  Filled 2023-09-06: qty 9, 40d supply, fill #0

## 2023-09-06 NOTE — Plan of Care (Signed)
 Pt has rested quietly throughout the night with no distress noted. Alert and oriented. On room air. SR on the monitor. Up to BR to void. No complaints voiced.     Problem: Metabolic: Goal: Ability to maintain appropriate glucose levels will improve Outcome: Progressing   Problem: Nutritional: Goal: Maintenance of adequate nutrition will improve Outcome: Progressing   Problem: Tissue Perfusion: Goal: Adequacy of tissue perfusion will improve Outcome: Progressing   Problem: Education: Goal: Knowledge of General Education information will improve Description: Including pain rating scale, medication(s)/side effects and non-pharmacologic comfort measures Outcome: Progressing   Problem: Health Behavior/Discharge Planning: Goal: Ability to manage health-related needs will improve Outcome: Progressing   Problem: Pain Managment: Goal: General experience of comfort will improve and/or be controlled Outcome: Progressing

## 2023-09-06 NOTE — Discharge Instructions (Signed)
 Follow with Primary MD McElwee, Lauren A, NP in 7 days   Get CBC, CMP, Magnesium , 2 view Chest X ray -  checked next visit with your primary MD    Activity: As tolerated with Full fall precautions use walker/cane & assistance as needed  Disposition Home    Diet: Heart Healthy low carbohydrate.  Accuchecks 4 times/day, Once in AM empty stomach and then before each meal. Log in all results and show them to your Prim.MD in 3 days. If any glucose reading is under 80 or above 300 call your Prim MD immidiately. Follow Low glucose instructions for glucose under 80 as instructed.   Special Instructions: If you have smoked or chewed Tobacco  in the last 2 yrs please stop smoking, stop any regular Alcohol  and or any Recreational drug use.  On your next visit with your primary care physician please Get Medicines reviewed and adjusted.  Please request your Prim.MD to go over all Hospital Tests and Procedure/Radiological results at the follow up, please get all Hospital records sent to your Prim MD by signing hospital release before you go home.  If you experience worsening of your admission symptoms, develop shortness of breath, life threatening emergency, suicidal or homicidal thoughts you must seek medical attention immediately by calling 911 or calling your MD immediately  if symptoms less severe.  You Must read complete instructions/literature along with all the possible adverse reactions/side effects for all the Medicines you take and that have been prescribed to you. Take any new Medicines after you have completely understood and accpet all the possible adverse reactions/side effects.   Do not drive when taking Pain medications.  Do not take more than prescribed Pain, Sleep and Anxiety Medications  Wear Seat belts while driving.

## 2023-09-06 NOTE — Progress Notes (Addendum)
 Reviewed AVS with patient and Answered all questions.  Picked up TOC meds and brought to room. Reviewed new insulin  medcation and reviewed how pen works and how to check blood surgars (CBG.)

## 2023-09-06 NOTE — TOC Transition Note (Signed)
 Transition of Care [facility]) - Discharge Note   Patient Details  Name: Martha Valencia MRN: [id number] Date of Birth: 08-12-1975  [facility] CM/SW Contact:  Marval Gell, RN Phone Number: 09/06/2023, 10:26 AM   Clinical Narrative:     Follow up apt for PCP added to AVS. Meds through [facility] pharmacy.   Final next level of care: Home/Self Care Barriers to Discharge: No Barriers Identified   Patient Goals and CMS Choice Patient states their goals for this hospitalization and ongoing recovery are:: to go home          Discharge Placement                       Discharge Plan and Services Additional resources added to the After Visit Summary for     Discharge Planning Services: CM Consult                                 Social Drivers of Health (SDOH) Interventions SDOH Screenings   Food Insecurity: No Food Insecurity (09/04/2023)  Housing: Low Risk  (09/04/2023)  Transportation Needs: No Transportation Needs (09/04/2023)  Utilities: Not At Risk (09/04/2023)  Depression (PHQ2-9): Low Risk  (10/22/2021)  Tobacco Use: Low Risk  (09/04/2023)     Readmission Risk Interventions     No data to display

## 2023-09-06 NOTE — Plan of Care (Signed)
 Problem: Education: Goal: Ability to describe self-care measures that may prevent or decrease complications (Diabetes Survival Skills Education) will improve Outcome: Adequate for Discharge Goal: Individualized Educational Video(s) Outcome: Adequate for Discharge   Problem: Coping: Goal: Ability to adjust to condition or change in health will improve Outcome: Adequate for Discharge   Problem: Fluid Volume: Goal: Ability to maintain a balanced intake and output will improve Outcome: Adequate for Discharge   Problem: Health Behavior/Discharge Planning: Goal: Ability to identify and utilize available resources and services will improve Outcome: Adequate for Discharge Goal: Ability to manage health-related needs will improve Outcome: Adequate for Discharge   Problem: Metabolic: Goal: Ability to maintain appropriate glucose levels will improve Outcome: Adequate for Discharge   Problem: Nutritional: Goal: Maintenance of adequate nutrition will improve Outcome: Adequate for Discharge Goal: Progress toward achieving an optimal weight will improve Outcome: Adequate for Discharge   Problem: Skin Integrity: Goal: Risk for impaired skin integrity will decrease Outcome: Adequate for Discharge   Problem: Tissue Perfusion: Goal: Adequacy of tissue perfusion will improve Outcome: Adequate for Discharge   Problem: Education: Goal: Ability to describe self-care measures that may prevent or decrease complications (Diabetes Survival Skills Education) will improve Outcome: Adequate for Discharge Goal: Individualized Educational Video(s) Outcome: Adequate for Discharge   Problem: Cardiac: Goal: Ability to maintain an adequate cardiac output will improve Outcome: Adequate for Discharge   Problem: Health Behavior/Discharge Planning: Goal: Ability to identify and utilize available resources and services will improve Outcome: Adequate for Discharge Goal: Ability to manage health-related  needs will improve Outcome: Adequate for Discharge   Problem: Fluid Volume: Goal: Ability to achieve a balanced intake and output will improve Outcome: Adequate for Discharge   Problem: Metabolic: Goal: Ability to maintain appropriate glucose levels will improve Outcome: Adequate for Discharge   Problem: Nutritional: Goal: Maintenance of adequate nutrition will improve Outcome: Adequate for Discharge Goal: Maintenance of adequate weight for body size and type will improve Outcome: Adequate for Discharge   Problem: Respiratory: Goal: Will regain and/or maintain adequate ventilation Outcome: Adequate for Discharge   Problem: Urinary Elimination: Goal: Ability to achieve and maintain adequate renal perfusion and functioning will improve Outcome: Adequate for Discharge   Problem: Education: Goal: Knowledge of General Education information will improve Description: Including pain rating scale, medication(s)/side effects and non-pharmacologic comfort measures Outcome: Adequate for Discharge   Problem: Health Behavior/Discharge Planning: Goal: Ability to manage health-related needs will improve Outcome: Adequate for Discharge   Problem: Clinical Measurements: Goal: Ability to maintain clinical measurements within normal limits will improve Outcome: Adequate for Discharge Goal: Will remain free from infection Outcome: Adequate for Discharge Goal: Diagnostic test results will improve Outcome: Adequate for Discharge Goal: Respiratory complications will improve Outcome: Adequate for Discharge Goal: Cardiovascular complication will be avoided Outcome: Adequate for Discharge   Problem: Activity: Goal: Risk for activity intolerance will decrease Outcome: Adequate for Discharge   Problem: Nutrition: Goal: Adequate nutrition will be maintained Outcome: Adequate for Discharge   Problem: Coping: Goal: Level of anxiety will decrease Outcome: Adequate for Discharge   Problem:  Elimination: Goal: Will not experience complications related to bowel motility Outcome: Adequate for Discharge Goal: Will not experience complications related to urinary retention Outcome: Adequate for Discharge   Problem: Pain Managment: Goal: General experience of comfort will improve and/or be controlled Outcome: Adequate for Discharge   Problem: Safety: Goal: Ability to remain free from injury will improve Outcome: Adequate for Discharge   Problem: Skin Integrity: Goal: Risk for impaired skin  integrity will decrease Outcome: Adequate for Discharge

## 2023-09-06 NOTE — Progress Notes (Signed)
   09/06/23 0828  Mobility  Activity Ambulated independently  Level of Assistance Independent  Assistive Device None  Distance Ambulated (ft) 500 ft  Activity Response Tolerated fair  Mobility Referral Yes  Mobility visit 1 Mobility  Mobility Specialist Start Time (ACUTE ONLY) [id number]  Mobility Specialist Stop Time (ACUTE ONLY) 0836  Mobility Specialist Time Calculation (min) (ACUTE ONLY) 8 min   Mobility Specialist: Progress Note  During Mobility: HR 113, SpO2 96% RA  Pt agreeable to mobility session - received in EOB.Pt was asymptomatic throughout session with no complaints. Returned to EOB with all needs met - call bell within reach. Husband and brother present.   Virgle Boards, BS Mobility Specialist Please contact via SecureChat or  Rehab office at [phone].

## 2023-09-06 NOTE — Discharge Summary (Signed)
 Martha Valencia FMW:[id number] DOB: 1975-12-26 DOA: 09/04/2023  PCP: Nedra Tinnie LABOR, NP  Admit date: 09/04/2023  Discharge date: 09/06/2023  Admitted From: Home   Disposition:  Home   Recommendations for Outpatient Follow-up:   Follow up with PCP in 1-2 weeks  PCP Please obtain BMP/CBC, 2 view CXR in 1week,  (see Discharge instructions)   PCP Please follow up on the following pending results: Please monitor BMP, magnesium  and glycemic control closely.  Home Health: None Equipment/Devices: None Consultations: Diabetic educator Discharge Condition: Stable    CODE STATUS: Full    Diet Recommendation: Heart Healthy Low Carb    Chief Complaint  Patient presents with   Weakness     Brief history of present illness from the day of admission and additional interim summary    48 y.o. female with past medical history  of  Htn coming for  Weakness 1 month, dizziness, lack of energy and also reports polyuria and polydipsia and yeast infection. Weight is 86.6 kg.no weight changes. Pt also has c/o dysuria.  No other complaints of fevers chills flank pain hematuria.  Back pain nausea vomiting chest pain palpitations.                                                                  Hospital Course   DKA in a patient with newly diagnosed DM type II.  Was treated with DKA protocol, DKA has resolved, transitioned to subcu insulin , has been adequately hydrated, diabetic and insulin  education provided, will be discharged on insulin , 1 month supply of insulin  and diabetic testing supplies provided.  Follow-up with PCP within a week.   Lab Results  Component Value Date   HGBA1C 11.8 (H) 09/04/2023   CBG (last 3)  Recent Labs    09/05/23 1630 09/05/23 2234 09/06/23 0755  GLUCAP 269* 266* 215*      Sepsis with UTI  present on admission.  Sepsis pathophysiology has resolved, discharged on 3 more days of oral Keflex .  Stable here.  Symptom-free and eager to go home.   Hypokalemia, hypoglycemia.  Replaced.   Discharge diagnosis     Principal Problem:   Generalized weakness    Discharge instructions    Discharge Instructions     Discharge instructions   Complete by: As directed    Follow with Primary MD McElwee, Lauren A, NP in 7 days   Get CBC, CMP, Magnesium , 2 view Chest X ray -  checked next visit with your primary MD    Activity: As tolerated with Full fall precautions use walker/cane & assistance as needed  Disposition Home    Diet: Heart Healthy low carbohydrate.  Accuchecks 4 times/day, Once in AM empty stomach and then before each meal. Log in all results and show them to your Prim.MD  in 3 days. If any glucose reading is under 80 or above 300 call your Prim MD immidiately. Follow Low glucose instructions for glucose under 80 as instructed.   Special Instructions: If you have smoked or chewed Tobacco  in the last 2 yrs please stop smoking, stop any regular Alcohol  and or any Recreational drug use.  On your next visit with your primary care physician please Get Medicines reviewed and adjusted.  Please request your Prim.MD to go over all Hospital Tests and Procedure/Radiological results at the follow up, please get all Hospital records sent to your Prim MD by signing hospital release before you go home.  If you experience worsening of your admission symptoms, develop shortness of breath, life threatening emergency, suicidal or homicidal thoughts you must seek medical attention immediately by calling 911 or calling your MD immediately  if symptoms less severe.  You Must read complete instructions/literature along with all the possible adverse reactions/side effects for all the Medicines you take and that have been prescribed to you. Take any new Medicines after you have completely  understood and accpet all the possible adverse reactions/side effects.   Do not drive when taking Pain medications.  Do not take more than prescribed Pain, Sleep and Anxiety Medications  Wear Seat belts while driving.   Increase activity slowly   Complete by: As directed        Discharge Medications   Allergies as of 09/06/2023   No Known Allergies      Medication List     TAKE these medications    aspirin-acetaminophen -caffeine 250-250-65 MG tablet Commonly known as: EXCEDRIN MIGRAINE Take 1 tablet by mouth daily as needed for headache.   Blood Glucose Monitoring Suppl Devi 1 each by Does not apply route in the morning, at noon, and at bedtime. May substitute to any manufacturer covered by patient's insurance.   BLOOD GLUCOSE TEST STRIPS Strp 1 each by In Vitro route in the morning, at noon, and at bedtime. May substitute to any manufacturer covered by patient's insurance.   cephALEXin  500 MG capsule Commonly known as: KEFLEX  Take 1 capsule (500 mg total) by mouth 3 (three) times daily for 3 days.   ferrous sulfate  325 (65 FE) MG tablet Take 1 tablet (325 mg total) by mouth daily.   insulin  aspart 100 UNIT/ML FlexPen Commonly known as: NOVOLOG  Before each meal 3 times a day, 140-199 - 2 units, 200-250 - 4 units, 251-299 - 6 units,  300-349 - 8 units,  350 or above 10 units.   insulin  glargine 100 UNIT/ML Solostar Pen Commonly known as: LANTUS  Inject 22 Units into the skin daily.   Insulin  Pen Needle 32G X 4 MM Misc Please provide 1 month supply   Insulin  Syringe-Needle U-100 25G X 1 1 ML Misc For 4 times a day insulin  SQ, 1 month supply. Diagnosis E11.65   Lancet Device Misc 1 each by Does not apply route in the morning, at noon, and at bedtime. May substitute to any manufacturer covered by patient's insurance.   Lancets Misc. Misc 1 each by Does not apply route in the morning, at noon, and at bedtime. May substitute to any manufacturer covered by patient's  insurance.         Follow-up Information     McElwee, Lauren A, NP Follow up.   Specialty: Internal Medicine Why: TIME :11:00 AM   PLEASE ARRIVE AT 10:30 AM DATE : AUGUST 11 , 2025  PLEASE BRING ALL CURRENT MEDICATION, ID and  INDS CARD Contact information: [address] [place] [address] [phone]                 Major procedures and Radiology Reports - PLEASE review detailed and final reports thoroughly  -         DG Chest Portable 1 View Result Date: 09/04/2023 CLINICAL DATA:  Edema. EXAM: PORTABLE CHEST 1 VIEW COMPARISON:  Chest x-ray 07/31/2021 FINDINGS: Underinflation. No consolidation, pneumothorax or effusion. No edema. Normal cardiopericardial silhouette. IMPRESSION: Underinflation.  No acute cardiopulmonary disease. Electronically Signed   By: Ranell Bring M.D.   On: 09/04/2023 12:44    Micro Results     Recent Results (from the past 240 hours)  Culture, blood (Routine X 2) w Reflex to ID Panel     Status: None (Preliminary result)   Collection Time: 09/04/23  3:32 PM   Specimen: BLOOD  Result Value Ref Range Status   Specimen Description BLOOD LEFT ANTECUBITAL  Final   Special Requests   Final    BOTTLES DRAWN AEROBIC AND ANAEROBIC Blood Culture results may not be optimal due to an inadequate volume of blood received in culture bottles   Culture   Final    NO GROWTH < 24 HOURS Performed at [facility], [address] [place], [place] [address]    Report Status PENDING  Incomplete  Culture, blood (Routine X 2) w Reflex to ID Panel     Status: None (Preliminary result)   Collection Time: 09/04/23  3:34 PM   Specimen: BLOOD  Result Value Ref Range Status   Specimen Description BLOOD LEFT ANTECUBITAL  Final   Special Requests   Final    BOTTLES DRAWN AEROBIC AND ANAEROBIC Blood Culture results may not be optimal due to an inadequate volume of blood received in culture bottles   Culture   Final    NO GROWTH < 24  HOURS Performed at [facility], [address], [place], [place] [address]    Report Status PENDING  Incomplete  MRSA Next Gen by PCR, Nasal     Status: None   Collection Time: 09/04/23  9:47 PM   Specimen: Nasal Mucosa; Nasal Swab  Result Value Ref Range Status   MRSA by PCR Next Gen NOT DETECTED NOT DETECTED Final    Comment: (NOTE) The GeneXpert MRSA Assay (FDA approved for NASAL specimens only), is one component of a comprehensive MRSA colonization surveillance program. It is not intended to diagnose MRSA infection nor to guide or monitor treatment for MRSA infections. Test performance is not FDA approved in patients less than 33 years old. Performed at [facility], [address] [place], [place] [address]     Today   Subjective    Shyrl Obi today has no headache,no chest abdominal pain,no new weakness tingling or numbness, feels much better wants to go home today.     Objective   Blood pressure 119/83, pulse 73, temperature 97.6 F (36.4 C), temperature source Oral, resp. rate 18, height 5' 4 (1.626 m), weight 82.2 kg, last menstrual period 08/28/2023, SpO2 100%.   Intake/Output Summary (Last 24 hours) at 09/06/2023 1022 Last data filed at 09/06/2023 0600 Gross per 24 hour  Intake 240 ml  Output --  Net 240 ml    Exam  Awake Alert, No new F.N deficits,    [place].AT,PERRAL Supple Neck,   Symmetrical Chest wall movement, Good air movement bilaterally, CTAB RRR,No Gallops,   +ve B.Sounds,  Abd Soft, Non tender,  No Cyanosis, Clubbing or edema    Data Review   Recent Labs  Lab 09/04/23 1022 09/04/23 1336 09/05/23 0640  WBC 7.7  --  6.2  HGB 14.1 13.3 12.7  HCT 41.0 39.0 36.1  PLT 359  --  297  MCV 86.7  --  84.5  MCH 29.8  --  29.7  MCHC 34.4  --  35.2  RDW 13.1  --  13.3  LYMPHSABS  --   --  1.7  MONOABS  --   --  0.4  EOSABS  --   --  0.2  BASOSABS  --   --  0.1    Recent Labs  Lab 09/04/23 1130 09/04/23 1336 09/04/23 1534  09/04/23 1929 09/04/23 2326 09/05/23 0640 09/06/23 0511  NA 136   < > 133* 138 138 135 138  K 3.2*   < > 3.4* 3.1* 3.1* 3.0* 3.8  CL 103  --  108 111 112* 107 107  CO2 17*  --  15* 17* 19* 19* 21*  ANIONGAP 16*  --  10 10 7 9 10   GLUCOSE 445*  --  232* 104* 123* 237* 186*  BUN 6  --  7 7 7 6 13   CREATININE 0.61  --  0.43* 0.37* 0.33* 0.42* 0.44  AST 14*  --   --   --   --   --   --   ALT 11  --   --   --   --   --   --   ALKPHOS 80  --   --   --   --   --   --   BILITOT 1.1  --   --   --   --   --   --   ALBUMIN 3.8  --   --   --   --   --   --   CRP  --   --   --   --   --  <0.5  --   PROCALCITON  --   --   --   --   --  <0.10  --   HGBA1C 11.8*  --   --   --   --   --   --   MG 1.7  --   --   --   --  1.7 1.6*  PHOS  --   --   --   --   --  2.5 3.2  CALCIUM 9.0  --  8.1* 7.9* 7.9* 8.2* 8.7*   < > = values in this interval not displayed.    Total Time in preparing paper work, data evaluation and todays exam - 35 minutes  Signature  -    Lavada Stank M.D on 09/06/2023 at 10:22 AM   -  To page go to [email or web address]

## 2023-09-06 NOTE — Progress Notes (Signed)
 DISCHARGE NOTE HOME SELIN EISLER to be discharged Home per MD order. Discussed prescriptions and follow up appointments with the patient. Prescriptions given to patient; medication list explained in detail. Patient verbalized understanding.  Skin clean, dry and intact without evidence of skin break down, no evidence of skin tears noted. IV catheter discontinued intact. Site without signs and symptoms of complications. Dressing and pressure applied. Pt denies pain at the site currently. No complaints noted.  Patient free of lines, drains, and wounds.   An After Visit Summary (AVS) was printed and given to the patient. Patient escorted via wheelchair, and discharged home via private auto.  Peyton SHAUNNA Pepper, RN

## 2023-09-07 ENCOUNTER — Telehealth: Payer: Self-pay

## 2023-09-07 NOTE — Transitions of Care (Post Inpatient/ED Visit) (Addendum)
   09/07/2023  Name: Martha Valencia MRN: [id number] DOB: 1975-02-24  Today's TOC FU Call Status: Today's TOC FU Call Status:: Successful TOC FU Call Completed TOC FU Call Complete Date: 09/07/23 Patient's Name and Date of Birth confirmed.  Transition Care Management Follow-up Telephone Call Date of Discharge: 09/06/23 Discharge Facility: [facility]) Type of Discharge: Inpatient Admission Primary Inpatient Discharge Diagnosis:: Generalized weakness; new onset diabetes How have you been since you were released from the hospital?: Better Any questions or concerns?: No  Items Reviewed: Did you receive and understand the discharge instructions provided?: Yes Medications obtained,verified, and reconciled?: No Medications Not Reviewed Reasons:: Other: (Patient states, No, I took my insulin  this morning and I am doing fine but thank you for calling, I am doing fine) Dietary orders reviewed?: No  Medications Reviewed Today: Patient politely declined review Medications Reviewed Today   Medications were not reviewed in this encounter     Home Care and Equipment/Supplies: Patient politely declined review    Follow up appointments reviewed: PCP Follow-up appointment confirmed?: Yes Date of PCP follow-up appointment?: 09/18/23 Follow-up Provider: Nedra Tinnie LABOR, NP Do you need transportation to your follow-up appointment?: No Do you understand care options if your condition(s) worsen?: Yes-patient verbalized understanding   Richerd Fish, RN, BSN, CCM [place]  [facility], [facility] RN Care Manager Direct Dial: [phone]

## 2023-09-08 ENCOUNTER — Telehealth: Payer: Self-pay

## 2023-09-08 NOTE — Progress Notes (Signed)
   09/08/2023  Patient ID: Antone ONEIDA Caller, female   DOB: 1976-01-28, 48 y.o.   MRN: [id number]  Patient recently discharged from hospital after being admitted with DKA (newly diagnosed type 2 diabetes).  Outreach attempt to check on patient and verify she has all necessary medications was not successful.  Patient is scheduled with PCP for 8/11, but I will attempt to check in again with her next week.  Channing DELENA Mealing, PharmD, DPLA

## 2023-09-09 LAB — CULTURE, BLOOD (ROUTINE X 2)
Culture: NO GROWTH
Culture: NO GROWTH

## 2023-09-12 ENCOUNTER — Telehealth: Payer: Self-pay

## 2023-09-12 NOTE — Progress Notes (Signed)
 09/12/2023 Name: Martha Valencia MRN: [id number] DOB: 12-14-1975  Chief Complaint  Patient presents with   Diabetes Management Plan   Martha Valencia is a 48 y.o. year old female who presented for a telephone visit.   They were referred to the pharmacist by clinical inpatient pharmacy team for assistance in managing diabetes.   Subjective:  Care Team: Primary Care Provider: Nedra Tinnie LABOR, NP ; Next Scheduled Visit: 09/18/2023  Medication Access/Adherence  Current Pharmacy:  [id number] - [place], [place] - [address] [place] [address] Phone: [phone] Fax: [phone]  -Patient reports affordability concerns with their medications: No  -Patient reports access/transportation concerns to their pharmacy: No  -Patient reports adherence concerns with their medications:  No    Diabetes: -Newly diagnosed type 2 diabetes after recent hospitalization for DKA -Patient discharged home 7/30 on Lantus  22 units daily and Novolog  SSI; endorses using medications as prescribed -Patient also received Accu Chek Guide glucometer, test strips and Softclix lancets -She is currently testing FBG and pre-prandial BG; endorses FBG this morning was 115 -Does not endorse any instances of hypoglycemia since initiating insulin  -Does endorse some lifestyle changes including exercising and dietary changes (did not elaborate on these) -A1c during hospitalization found to be 11.8%  Objective: Lab Results  Component Value Date   HGBA1C 11.8 (H) 09/04/2023   Lab Results  Component Value Date   CREATININE 0.44 09/06/2023   BUN 13 09/06/2023   NA 138 09/06/2023   K 3.8 09/06/2023   CL 107 09/06/2023   CO2 21 (L) 09/06/2023   Lab Results  Component Value Date   CHOL 187 10/22/2021   HDL 65.10 10/22/2021   LDLCALC 109 (H) 10/22/2021   TRIG 64.0 10/22/2021   CHOLHDL 3 10/22/2021   Medications Reviewed Today     Reviewed by Deanna Channing LABOR, RPH  (Pharmacist) on 09/12/23 at 1917  Med List Status: <None>   Medication Order Taking? Sig Documenting Provider Last Dose Status Informant  Accu-Chek Softclix Lancets lancets [id number] Yes Use in the morning, at noon, and at bedtime. May substitute to any manufacturer covered by patient's insurance. Singh, Prashant K, MD  Active   aspirin-acetaminophen -caffeine (EXCEDRIN MIGRAINE) 250-250-65 MG tablet [id number]  Take 1 tablet by mouth daily as needed for headache. [provider]  Active Self, Pharmacy Records  Blood Glucose Monitoring Suppl (BLOOD GLUCOSE MONITOR SYSTEM) w/Device KIT [id number] Yes Use in the morning, at noon, and at bedtime. May substitute to any manufacturer covered by patient's insurance. Singh, Prashant K, MD  Active   ferrous sulfate  325 (65 FE) MG tablet [id number]  Take 1 tablet (325 mg total) by mouth daily. Nedra Tinnie LABOR, NP  Active Self, Pharmacy Records  Glucose Blood (BLOOD GLUCOSE TEST STRIPS) STRP [id number] Yes Use in the morning, at noon, and at bedtime. May substitute to any manufacturer covered by patient's insurance. Singh, Prashant K, MD  Active   insulin  aspart (NOVOLOG ) 100 UNIT/ML FlexPen [id number] Yes Inject Novolog  before each meal 3 times a day, 140-199 - 2 units, 200-250 - 4 units, 251-299 - 6 units,  300-349 - 8 units,  350 or above 10 units. Singh, Prashant K, MD  Active   insulin  glargine (LANTUS ) 100 UNIT/ML Solostar Pen [id number] Yes Inject 22 Units into the skin daily. Singh, Prashant K, MD  Active   Insulin  Pen Needle (TECHLITE PEN NEEDLES) 32G X 4 MM MISC [id number]  Use as directed up to four times daily  Singh, Prashant K, MD  Active   Insulin  Pen Needle 32G X 4 MM MISC [id number] Yes Use to inject insulin  4 times daily as directed. Singh, Prashant K, MD  Active   Lancet Device MISC [id number] Yes 1 each by Does not apply route in the morning, at noon, and at bedtime. May substitute to any manufacturer covered by patient's insurance. Dennise Lavada POUR, MD  Active            Assessment/Plan:   Diabetes: -Currently uncontrolled -Patient sees PCP 8/11 for hospital discharge follow-up -I recommend consideration of GLP1 therapy at this time for A1c reduction and ASCVD risk prevention; Ozempic or Trulicity are preferred by patient's Medicaid -Once A1c <9% and verified no ketones in urine, I also recommend SGLT2 therapy for renal protection. -Insurance will cover Libre 3+ or Dexcom  G7 for CGM- I recommend initiation especially with insulin  use  -I would also recommend initiation of statin therapy with recent DM diagnosis (last lipid panel was in 2023 and LDL was elevated at 109.  If statin started, follow-up lipid panel and LFT's recommended in another 12 weeks.  Follow Up Plan: Collaborating with PCP prior to patient's upcoming visit and will follow-up with patient based on any medication changes  Channing DELENA Mealing, PharmD, DPLA

## 2023-09-18 ENCOUNTER — Ambulatory Visit: Payer: Self-pay | Admitting: Nurse Practitioner

## 2023-09-18 ENCOUNTER — Encounter: Payer: Self-pay | Admitting: Nurse Practitioner

## 2023-09-18 VITALS — BP 122/84 | HR 85 | Temp 97.2°F | Ht 64.0 in | Wt 181.0 lb

## 2023-09-18 DIAGNOSIS — R8290 Unspecified abnormal findings in urine: Secondary | ICD-10-CM

## 2023-09-18 DIAGNOSIS — E119 Type 2 diabetes mellitus without complications: Secondary | ICD-10-CM | POA: Insufficient documentation

## 2023-09-18 DIAGNOSIS — Z7984 Long term (current) use of oral hypoglycemic drugs: Secondary | ICD-10-CM

## 2023-09-18 LAB — POCT URINALYSIS DIPSTICK
Bilirubin, UA: NEGATIVE
Blood, UA: NEGATIVE
Glucose, UA: NEGATIVE
Ketones, UA: NEGATIVE
Nitrite, UA: NEGATIVE
Protein, UA: NEGATIVE
Spec Grav, UA: 1.01 (ref 1.010–1.025)
Urobilinogen, UA: 0.2 U/dL
pH, UA: 7.5 (ref 5.0–8.0)

## 2023-09-18 LAB — MICROALBUMIN / CREATININE URINE RATIO
Creatinine,U: 20.3 mg/dL
Microalb Creat Ratio: 95.3 mg/g — ABNORMAL HIGH (ref 0.0–30.0)
Microalb, Ur: 1.9 mg/dL (ref 0.0–1.9)

## 2023-09-18 MED ORDER — GLIPIZIDE ER 5 MG PO TB24: 5.0000 mg | ORAL_TABLET | Freq: Every day | ORAL | 0 refills | Status: DC

## 2023-09-18 MED ORDER — EMPAGLIFLOZIN 10 MG PO TABS: 10.0000 mg | ORAL_TABLET | Freq: Every day | ORAL | 0 refills | Status: DC

## 2023-09-18 MED ORDER — METFORMIN HCL ER 500 MG PO TB24: 500.0000 mg | ORAL_TABLET | Freq: Every day | ORAL | 0 refills | Status: DC

## 2023-09-18 NOTE — Addendum Note (Signed)
 Addended by: Lulia Schriner A on: 09/18/2023 04:55 PM   Modules accepted: Level of Service

## 2023-09-18 NOTE — Assessment & Plan Note (Addendum)
 Newly diagnosed with severe hyperglycemia and dehydration, her glucose levels have improved with insulin  and lifestyle changes. She prefers oral medication and is traveling soon, necessitating careful medication adjustment. Decrease lantus  insulin  to 10 units at bedtime. Start metformin  XR 500mg  daily and Glipizide  XL 5mg  daily, each 1 tablet daily with food. Monitor blood glucose levels three times daily. Educated on hypoglycemia signs: shaking, sweating, headache, hunger. Schedule follow-up next week to assess medication response. U/A negative for ketones today, however did show 3+ leukocytes. Will add on urine culture as noted below. Check urine microalbumin today. Will discuss ACE/ARB and statin at next visit. She is going [place] of the country [place] 2 [place] next week and hesitant to start too many medications before going out of the country. We also discussed nutrition, carbs, and sugars. A handout was provided. Follow-up in 1 week. Medication reconciliation completed.

## 2023-09-18 NOTE — Progress Notes (Addendum)
 Established Patient Office Visit  Subjective   Patient ID: Martha Valencia, female    DOB: 1976/01/31  Age: 48 y.o. MRN: [id number]  Chief Complaint  Patient presents with   Hospitalization Follow-up    [facility] to ED on 09/06/23 for Dehydration, concerns with travel and what medication is needed    HPI Discussed the use of AI scribe software for clinical note transcription with the patient, who gave verbal consent to proceed.  History of Present Illness   Martha Valencia is a 48 year old female with newly diagnosed diabetes who presents for follow-up after recent hospitalization.  She was hospitalized for severe dehydration and had a blood sugar level of 418 mg/dL, leading to a diabetes diagnosis with DKA. Prior to hospitalization, she experienced dry mouth, excessive thirst, fatigue, changes in her feet, confusion, and a heart rate of 105 bpm. She was given IV insulin  and IV fluids.   Since discharge, she monitors her blood sugar levels regularly, with improvements noted. A recent evening reading was 114 mg/dL. She takes 22 units of insulin  at bedtime and adjusts with meals based on sugar levels.  Her diet is vegan, including beans, lentils, spinach, green beans, lettuce, avocado, and broccoli. She occasionally eats barley bread and avoids animal products. She fasts regularly for religious reasons and exercises daily. She reports increased energy and well-being with these lifestyle changes.  No numbness or tingling in her feet.     Transition of Care Hospital Follow up.   Hospital/Facility: [facility] D/C Physician:  Dr. Lavada Stank D/C Date: 09/06/23  Records Requested: 09/18/23 Records Received: 09/18/23 Records Reviewed: 09/18/23  Diagnoses on Discharge: DKA newly diagnosed DM, sepsis, hypokalemia  Date of interactive Contact within 48 hours of discharge: 09/07/23 Contact was through: phone  Date of 7 day or 14 day face-to-face visit:    within 14 days  Outpatient  Encounter Medications as of 09/18/2023  Medication Sig   Accu-Chek Softclix Lancets lancets Use in the morning, at noon, and at bedtime. May substitute to any manufacturer covered by patient's insurance.   Blood Glucose Monitoring Suppl (BLOOD GLUCOSE MONITOR SYSTEM) w/Device KIT Use in the morning, at noon, and at bedtime. May substitute to any manufacturer covered by patient's insurance.   ferrous sulfate  325 (65 FE) MG tablet Take 1 tablet (325 mg total) by mouth daily.   glipiZIDE  (GLUCOTROL  XL) 5 MG 24 hr tablet Take 1 tablet (5 mg total) by mouth daily with breakfast.   Glucose Blood (BLOOD GLUCOSE TEST STRIPS) STRP Use in the morning, at noon, and at bedtime. May substitute to any manufacturer covered by patient's insurance.   insulin  aspart (NOVOLOG ) 100 UNIT/ML FlexPen Inject Novolog  before each meal 3 times a day, 140-199 - 2 units, 200-250 - 4 units, 251-299 - 6 units,  300-349 - 8 units,  350 or above 10 units.   insulin  glargine (LANTUS ) 100 UNIT/ML Solostar Pen Inject 22 Units into the skin daily.   Insulin  Pen Needle (TECHLITE PEN NEEDLES) 32G X 4 MM MISC Use as directed up to four times daily   Insulin  Pen Needle 32G X 4 MM MISC Use to inject insulin  4 times daily as directed.   Lancet Device MISC 1 each by Does not apply route in the morning, at noon, and at bedtime. May substitute to any manufacturer covered by patient's insurance.   metFORMIN  (GLUCOPHAGE -XR) 500 MG 24 hr tablet Take 1 tablet (500 mg total) by mouth daily with breakfast.   [  DISCONTINUED] empagliflozin  (JARDIANCE ) 10 MG TABS tablet Take 1 tablet (10 mg total) by mouth daily before breakfast.   aspirin-acetaminophen -caffeine (EXCEDRIN MIGRAINE) 250-250-65 MG tablet Take 1 tablet by mouth daily as needed for headache. (Patient not taking: Reported on 09/18/2023)   No facility-administered encounter medications on file as of 09/18/2023.    Diagnostic Tests Reviewed/Disposition: Reviewed on chart  Consults: Diabetic  Educator  Discharge Instructions - Follow up with PCP in 1-2 weeks - Carb mod diet -Start lantus  22 units daily -Sliding scale insulin  with meals  Disease/illness Education: Discussed with patient  Home Health/Community Services Discussions/Referrals: N/A  Establishment or re-establishment of referral orders for community resources: N/A  Discussion with other health care providers: Reviewed notes  Assessment and Support of treatment regimen adherence: Discussed with patient  Appointments Coordinated with: N/A  Education for self-management, independent living, and ADLs: Discussed with patient    ROS See pertinent positives and negatives per HPI.    Objective:     BP 122/84 (BP Location: Left Arm, Patient Position: Sitting, Cuff Size: Normal)   Pulse 85   Temp (!) 97.2 F (36.2 C)   Ht 5' 4 (1.626 m)   Wt 181 lb (82.1 kg)   LMP 08/28/2023 (Exact Date)   SpO2 99%   BMI 31.07 kg/m    Physical Exam Vitals and nursing note reviewed.  Constitutional:      General: She is not in acute distress.    Appearance: Normal appearance.  HENT:     Head: Normocephalic.  Eyes:     Conjunctiva/sclera: Conjunctivae normal.  Cardiovascular:     Rate and Rhythm: Normal rate and regular rhythm.     Pulses: Normal pulses.     Heart sounds: Normal heart sounds.  Pulmonary:     Effort: Pulmonary effort is normal.     Breath sounds: Normal breath sounds.  Musculoskeletal:     Cervical back: Normal range of motion.  Skin:    General: Skin is warm.  Neurological:     General: No focal deficit present.     Mental Status: She is alert and oriented to person, place, and time.  Psychiatric:        Mood and Affect: Mood normal.        Behavior: Behavior normal.        Thought Content: Thought content normal.        Judgment: Judgment normal.   The 10-year ASCVD risk score (Arnett DK, et al., 2019) is: 1.9%    Assessment & Plan:   Problem List Items Addressed This Visit        Endocrine   Diabetes mellitus without complication (HCC) - Primary   Newly diagnosed with severe hyperglycemia and dehydration, her glucose levels have improved with insulin  and lifestyle changes. She prefers oral medication and is traveling soon, necessitating careful medication adjustment. Decrease lantus  insulin  to 10 units at bedtime. Start metformin  XR 500mg  daily and Glipizide  XL 5mg  daily, each 1 tablet daily with food. Monitor blood glucose levels three times daily. Educated on hypoglycemia signs: shaking, sweating, headache, hunger. Schedule follow-up next week to assess medication response. U/A negative for ketones today, however did show 3+ leukocytes. Will add on urine culture as noted below. Check urine microalbumin today. Will discuss ACE/ARB and statin at next visit. She is going out of the country for 2 months next week and hesitant to start too many medications before going out of the country. We also discussed nutrition, carbs, and sugars. A  handout was provided. Follow-up in 1 week. Medication reconciliation completed.      Relevant Medications   metFORMIN  (GLUCOPHAGE -XR) 500 MG 24 hr tablet   glipiZIDE  (GLUCOTROL  XL) 5 MG 24 hr tablet   Other Relevant Orders   Microalbumin / creatinine urine ratio (Completed)   POCT urinalysis dipstick (Completed)   Other Visit Diagnoses       Abnormal urine       Add urine culture, u/a showed 3+ leukocytes   Relevant Orders   Urine Culture       Return in about 1 week (around 09/25/2023) for Diabetes.   I personally spent a total of 30 minutes in the care of the patient today including preparing to see the patient, performing a medically appropriate exam/evaluation, counseling and educating, documenting clinical information in the EHR, and discussing nutrition, signs of hypoglycemia.   Tinnie DELENA Harada, NP

## 2023-09-18 NOTE — Patient Instructions (Addendum)
 It was great to see you!  Decrease insulin  to 10 units daily at bedtime  Start metformin  1 tablet daily and glipizide  1 tablet daily with food   Keep checking your blood sugar 3 times a day   Signs of low blood sugars (less than 70): shaking, sweating, headache, extra hungry - check your sugars and have a sugary snack if low  Goal is for your sugar to be less than 130 in the morning before you eat, and less than 200 at other meals   Let's follow-up in 1 week, sooner if you have concerns.  If a referral was placed today, you will be contacted for an appointment. Please note that routine referrals can sometimes take up to 3-4 weeks to process. Please call our office if you haven't heard anything after this time frame.  Take care,  Tinnie Harada, NP

## 2023-09-19 LAB — URINE CULTURE
MICRO NUMBER:: 16813578
Result:: NO GROWTH
SPECIMEN QUALITY:: ADEQUATE

## 2023-09-20 ENCOUNTER — Telehealth: Payer: Self-pay

## 2023-09-20 ENCOUNTER — Ambulatory Visit: Payer: Self-pay | Admitting: Nurse Practitioner

## 2023-09-20 MED ORDER — DAPAGLIFLOZIN PROPANEDIOL 5 MG PO TABS: 5.0000 mg | ORAL_TABLET | Freq: Every day | ORAL | 2 refills | Status: DC

## 2023-09-20 NOTE — Telephone Encounter (Signed)
 Called and spoke with patient and notified her of below message. She did pick up Glipizide  and Metformin  and started on these two and will let us  know regarding the Farxiga .

## 2023-09-20 NOTE — Telephone Encounter (Signed)
 Copied from CRM [id number]. Topic: Clinical - Prescription Issue >> Sep 20, 2023  9:32 AM Carlyon D wrote: Reason for CRM: Pt is stating her medication Jardiance  10MG   at [facility] is way to expensive for her to pick up.  Pt is asking if some one can reach out to her in regards to this to see if there is something else she can take that would be cheaper or if she can get this medication else where? Please reach out to pt.

## 2023-09-20 NOTE — Telephone Encounter (Signed)
 Noted

## 2023-09-21 ENCOUNTER — Telehealth: Payer: Self-pay

## 2023-09-21 ENCOUNTER — Other Ambulatory Visit (HOSPITAL_COMMUNITY): Payer: Self-pay

## 2023-09-21 NOTE — Telephone Encounter (Signed)
 Pharmacy Patient Advocate Encounter   Received notification from Onbase that prior authorization for Farxiga  5is required/requested.   Insurance verification completed.   The patient is insured through [facility] .   Per test claim: PA required; PA submitted to above mentioned insurance via Latent Key/confirmation #/EOC [id number] Status is pending

## 2023-09-22 ENCOUNTER — Other Ambulatory Visit (HOSPITAL_COMMUNITY): Payer: Self-pay

## 2023-09-22 NOTE — Telephone Encounter (Signed)
 I called and spoke with patient and notified her of below message and denial message. Patient said that is ok and will continue to take Metformin  and Glipizide  now.

## 2023-09-22 NOTE — Telephone Encounter (Signed)
 Pharmacy Patient Advocate Encounter  Received notification from [employer] that Prior Authorization for Farxiga  5 has been DENIED.  Full denial letter will be uploaded to the media tab. See denial reason below.     PA #/Case ID/Reference #: [id number]

## 2023-09-26 ENCOUNTER — Encounter: Payer: Self-pay | Admitting: Nurse Practitioner

## 2023-09-26 ENCOUNTER — Ambulatory Visit (INDEPENDENT_AMBULATORY_CARE_PROVIDER_SITE_OTHER): Payer: Self-pay | Admitting: Nurse Practitioner

## 2023-09-26 VITALS — BP 122/80 | HR 85 | Temp 96.9°F | Ht 64.0 in | Wt 183.0 lb

## 2023-09-26 DIAGNOSIS — Z7984 Long term (current) use of oral hypoglycemic drugs: Secondary | ICD-10-CM | POA: Diagnosis not present

## 2023-09-26 DIAGNOSIS — E119 Type 2 diabetes mellitus without complications: Secondary | ICD-10-CM | POA: Diagnosis not present

## 2023-09-26 MED ORDER — ACCU-CHEK SOFTCLIX LANCETS MISC
1.0000 | Freq: Three times a day (TID) | 3 refills | Status: AC
Start: 2023-09-26 — End: 2023-10-26

## 2023-09-26 NOTE — Progress Notes (Signed)
 Established Patient Office Visit  Subjective   Patient ID: Martha Valencia, female    DOB: April 13, 1975  Age: 48 y.o. MRN: [id number]  Chief Complaint  Patient presents with   Diabetes    Follow up and discuss medications, Rx refill    HPI Discussed the use of AI scribe software for clinical note transcription with the patient, who gave verbal consent to proceed.  History of Present Illness   Martha Valencia is a 48 year old female with diabetes who presents for follow-up regarding her diabetes management.  She has significantly improved her diabetes management, feeling much better and satisfied with her current regimen. She has reduced her insulin  use, taking it only once in the evening on one day, and otherwise manages her blood sugar with metformin  and glipizide . Her blood sugars have ranged from 87-155 over the last week.   She is planning to travel to [place] in a few days for two months and has ensured she has enough medication for the trip. She has been actively managing her diabetes through diet and exercise, which has been effective. She occasionally experiences headaches, for which she takes over-the-counter medications.        ROS See pertinent positives and negatives per HPI.    Objective:     BP 122/80 (BP Location: Right Arm, Patient Position: Sitting, Cuff Size: Normal)   Pulse 85   Temp (!) 96.9 F (36.1 C)   Ht 5' 4 (1.626 m)   Wt 183 lb (83 kg)   LMP 08/28/2023 (Exact Date)   SpO2 99%   BMI 31.41 kg/m    Physical Exam Vitals and nursing note reviewed.  Constitutional:      General: She is not in acute distress.    Appearance: Normal appearance.  HENT:     Head: Normocephalic.  Eyes:     Conjunctiva/sclera: Conjunctivae normal.  Cardiovascular:     Rate and Rhythm: Normal rate and regular rhythm.     Pulses: Normal pulses.     Heart sounds: Normal heart sounds.  Pulmonary:     Effort: Pulmonary effort is normal.     Breath sounds: Normal  breath sounds.  Musculoskeletal:     Cervical back: Normal range of motion.  Skin:    General: Skin is warm.  Neurological:     General: No focal deficit present.     Mental Status: She is alert and oriented to person, place, and time.  Psychiatric:        Mood and Affect: Mood normal.        Behavior: Behavior normal.        Thought Content: Thought content normal.        Judgment: Judgment normal.    The 10-year ASCVD risk score (Arnett DK, et al., 2019) is: 1.9%    Assessment & Plan:   Problem List Items Addressed This Visit       Endocrine   Diabetes mellitus without complication (HCC) - Primary   Chronic, improving. Positive outcomes have been achieved through lifestyle modifications. She is prepared for travel with sufficient medication. Continue meformin XR 500mg  daily and glipizide  XL 5mg  daily and discontinue insulin . Ensure a two-month supply of oral medications for travel. Educated on hypoglycemia signs and management. Follow up in three months for blood work and reassessment. Will discuss statin and ACE/ARB next visit as there is not enough time before she leaves the country for 2 months to follow-up with labs and if there  are side effects.          Other   Long term current use of oral hypoglycemic drug   Continue metformin  XR 500mg  daily and Glipizide  XL 5mg  daily        Return in about 3 months (around 12/27/2023) for Diabetes.    Tinnie DELENA Harada, NP

## 2023-09-26 NOTE — Assessment & Plan Note (Signed)
 Chronic, improving. Positive outcomes have been achieved through lifestyle modifications. She is prepared for travel with sufficient medication. Continue meformin XR 500mg  daily and glipizide  XL 5mg  daily and discontinue insulin . Ensure a two-month supply of oral medications for travel. Educated on hypoglycemia signs and management. Follow up in three months for blood work and reassessment. Will discuss statin and ACE/ARB next visit as there is not enough time before she leaves the country for 2 months to follow-up with labs and if there are side effects.

## 2023-09-26 NOTE — Patient Instructions (Signed)
 It was great to see you!  Keep up the great work!   Stop the insulins   Just keep taking the 2 pills for your sugars   Keep taking the iron and any medications as needed   Let's follow-up in 3 months, sooner if you have concerns.  If a referral was placed today, you will be contacted for an appointment. Please note that routine referrals can sometimes take up to 3-4 weeks to process. Please call our office if you haven't heard anything after this time frame.  Take care,  Tinnie Harada, NP

## 2023-09-26 NOTE — Assessment & Plan Note (Signed)
 Continue metformin  XR 500mg  daily and Glipizide  XL 5mg  daily

## 2023-12-04 ENCOUNTER — Ambulatory Visit: Payer: Self-pay | Admitting: Nurse Practitioner

## 2023-12-04 ENCOUNTER — Encounter: Payer: Self-pay | Admitting: Nurse Practitioner

## 2023-12-04 ENCOUNTER — Ambulatory Visit: Admitting: Nurse Practitioner

## 2023-12-04 VITALS — BP 120/82 | HR 78 | Temp 96.8°F | Ht 64.0 in | Wt 180.2 lb

## 2023-12-04 DIAGNOSIS — E119 Type 2 diabetes mellitus without complications: Secondary | ICD-10-CM | POA: Diagnosis not present

## 2023-12-04 DIAGNOSIS — Z7984 Long term (current) use of oral hypoglycemic drugs: Secondary | ICD-10-CM | POA: Diagnosis not present

## 2023-12-04 LAB — POCT GLYCOSYLATED HEMOGLOBIN (HGB A1C)
HbA1c POC (<> result, manual entry): 5.7 % (ref 4.0–5.6)
HbA1c, POC (controlled diabetic range): 5.7 % (ref 0.0–7.0)
HbA1c, POC (prediabetic range): 5.7 % (ref 5.7–6.4)
Hemoglobin A1C: 5.7 % — AB (ref 4.0–5.6)

## 2023-12-04 LAB — LIPID PANEL
Cholesterol: 174 mg/dL (ref 0–200)
HDL: 58.6 mg/dL (ref >39.00)
LDL Cholesterol: 96 mg/dL (ref 0–99)
NonHDL: 115.27
Total CHOL/HDL Ratio: 3
Triglycerides: 94 mg/dL (ref 0.0–149.0)
VLDL: 18.8 mg/dL (ref 0.0–40.0)

## 2023-12-04 LAB — BASIC METABOLIC PANEL WITH GFR
BUN: 8 mg/dL (ref 6–23)
CO2: 26 meq/L (ref 19–32)
Calcium: 9.4 mg/dL (ref 8.4–10.5)
Chloride: 105 meq/L (ref 96–112)
Creatinine, Ser: 0.5 mg/dL (ref 0.40–1.20)
GFR: 111.78 mL/min (ref >60.00)
Glucose, Bld: 64 mg/dL — ABNORMAL LOW (ref 70–99)
Potassium: 4.3 meq/L (ref 3.5–5.1)
Sodium: 139 meq/L (ref 135–145)

## 2023-12-04 MED ORDER — ROSUVASTATIN CALCIUM 5 MG PO TABS: 5.0000 mg | ORAL_TABLET | Freq: Every day | ORAL | 0 refills | Status: DC

## 2023-12-04 NOTE — Progress Notes (Signed)
 Established Patient Office Visit  Subjective   Patient ID: Martha Valencia, female    DOB: 1975/11/23  Age: 48 y.o. MRN: [id number]  Chief Complaint  Patient presents with   Diabetes    Follow up, concerns with glucose dropping, sweating, and headaches   HPI:  Discussed the use of AI scribe software for clinical note transcription with the patient, who gave verbal consent to proceed.  History of Present Illness   Martha Valencia is a 48 year old female with diabetes who presents with episodes of low blood sugar.  She experiences hypoglycemia, particularly after taking her medication, mainly before lunch and sometimes before dinner, with less frequency overnight. During these episodes, she feels sweaty, and her blood sugar levels have dropped to 50-51 mg/dL, with other readings at 66, 59, and 55 mg/dL. She maintains her usual diet and routine, regularly checks her blood sugar, and has brought her records. Eating resolves the hypoglycemia, but the issue arises post-medication. She takes glipizide  and metformin , noting stable blood sugar levels when not taking the medication. No chest pain, shortness of breath, numbness, or tingling in feet.       ROS See pertinent positives and negatives per HPI.    Objective:     BP 120/82 (BP Location: Left Arm, Patient Position: Sitting, Cuff Size: Normal)   Pulse 78   Temp (!) 96.8 F (36 C)   Ht 5' 4 (1.626 m)   Wt 180 lb 3.2 oz (81.7 kg)   LMP 10/28/2023 (Approximate)   SpO2 100%   BMI 30.93 kg/m    Physical Exam Vitals and nursing note reviewed.  Constitutional:      General: She is not in acute distress.    Appearance: Normal appearance.  HENT:     Head: Normocephalic.  Eyes:     Conjunctiva/sclera: Conjunctivae normal.  Cardiovascular:     Rate and Rhythm: Normal rate and regular rhythm.     Pulses: Normal pulses.     Heart sounds: Normal heart sounds.  Pulmonary:     Effort: Pulmonary effort is normal.     Breath  sounds: Normal breath sounds.  Musculoskeletal:     Cervical back: Normal range of motion.  Skin:    General: Skin is warm.  Neurological:     General: No focal deficit present.     Mental Status: She is alert and oriented to person, place, and time.  Psychiatric:        Mood and Affect: Mood normal.        Behavior: Behavior normal.        Thought Content: Thought content normal.        Judgment: Judgment normal.      Results for orders placed or performed in visit on 12/04/23  POCT glycosylated hemoglobin (Hb A1C)  Result Value Ref Range   Hemoglobin A1C 5.7 (A) 4.0 - 5.6 %   HbA1c POC (<> result, manual entry) 5.7 4.0 - 5.6 %   HbA1c, POC (prediabetic range) 5.7 5.7 - 6.4 %   HbA1c, POC (controlled diabetic range) 5.7 0.0 - 7.0 %      The 10-year ASCVD risk score (Arnett DK, et al., 2019) is: 1.7%    Assessment & Plan:   Problem List Items Addressed This Visit       Endocrine   Diabetes mellitus without complication (HCC) - Primary   Chronic, ongoing. Hypoglycemia is likely due to glipizide , as A1c and blood sugar levels are near  normal. Discontinue glipizide  and continue metformin  XR 500mg  once daily in the morning. She should monitor blood glucose once daily in the morning before eating and additionally if symptomatic of hypoglycemia. Advise consuming a snack or juice if blood glucose is less than 70 mg/dL. Instruct to call if hypoglycemia occurs while on metformin  alone. Follow-up in 3 months. Check BMP, lipid panel today. Most likely will start rosuvastatin.       Relevant Orders   POCT glycosylated hemoglobin (Hb A1C) (Completed)   Basic metabolic panel with GFR   Lipid panel     Other   Long term current use of oral hypoglycemic drug   Continue metformin  XR 500mg  daily. Stop glipizide .        Return in about 3 months (around 03/05/2024) for Diabetes.    Tinnie DELENA Harada, NP

## 2023-12-04 NOTE — Assessment & Plan Note (Signed)
 Chronic, ongoing. Hypoglycemia is likely due to glipizide , as A1c and blood sugar levels are near normal. Discontinue glipizide  and continue metformin  XR 500mg  once daily in the morning. She should monitor blood glucose once daily in the morning before eating and additionally if symptomatic of hypoglycemia. Advise consuming a snack or juice if blood glucose is less than 70 mg/dL. Instruct to call if hypoglycemia occurs while on metformin  alone. Follow-up in 3 months. Check BMP, lipid panel today. Most likely will start rosuvastatin.

## 2023-12-04 NOTE — Assessment & Plan Note (Signed)
 Continue metformin  XR 500mg  daily. Stop glipizide .

## 2023-12-04 NOTE — Patient Instructions (Signed)
 It was great to see you!  Stop the glipizide    Keep taking metformin  1 tablet daily   Check your sugars once a day or if you feel like it is low (sweating, shaky, headache)  If it is less than 70, have a cup of juice, or a snack with sugar to bring it up   We will check your labs and I will let you know about starting a cholesterol medication called rosuvastatin   I recommend a yearly eye exam   Let's follow-up in 3 months, sooner if you have concerns.  If a referral was placed today, you will be contacted for an appointment. Please note that routine referrals can sometimes take up to 3-4 weeks to process. Please call our office if you haven't heard anything after this time frame.  Take care,  Tinnie Harada, NP

## 2023-12-06 ENCOUNTER — Other Ambulatory Visit: Payer: Self-pay | Admitting: Nurse Practitioner

## 2023-12-06 MED ORDER — METFORMIN HCL ER 500 MG PO TB24: 500.0000 mg | ORAL_TABLET | Freq: Every day | ORAL | 0 refills | Status: DC

## 2023-12-06 NOTE — Telephone Encounter (Signed)
 Copied from CRM [id number]. Topic: Clinical - Medication Refill >> Dec 06, 2023 11:15 AM Alfonso ORN wrote: Medication:  metFORMIN  (GLUCOPHAGE -XR) 500 MG 24 hr tablet  Has the patient contacted their pharmacy? No   This is the patient's preferred pharmacy:  [id number] [address], [place] - [address] [place] [address] Phone: [phone] Fax: [phone]  Is this the correct pharmacy for this prescription? Yes   Has the prescription been filled recently? Yes  Is the patient out of the medication? Yes  Has the patient been seen for an appointment in the last year OR does the patient have an upcoming appointment? Yes  Can we respond through MyChart? Yes

## 2023-12-14 ENCOUNTER — Other Ambulatory Visit: Payer: Self-pay | Admitting: Nurse Practitioner

## 2023-12-14 NOTE — Telephone Encounter (Signed)
 Requesting: GLIPIZIDE  ER 5 MG TABLET  Last Visit: 12/04/2023 Next Visit: 03/05/2024 Last Refill: 09/18/2023  Please Advise

## 2024-02-19 ENCOUNTER — Encounter

## 2024-03-05 ENCOUNTER — Ambulatory Visit: Admitting: Nurse Practitioner

## 2024-03-13 ENCOUNTER — Ambulatory Visit: Admitting: Nurse Practitioner

## 2024-03-13 ENCOUNTER — Ambulatory Visit: Payer: Self-pay | Admitting: Nurse Practitioner

## 2024-03-13 ENCOUNTER — Encounter: Payer: Self-pay | Admitting: Nurse Practitioner

## 2024-03-13 VITALS — BP 116/72 | HR 79 | Temp 97.4°F | Ht 64.0 in | Wt 185.0 lb

## 2024-03-13 DIAGNOSIS — Z7984 Long term (current) use of oral hypoglycemic drugs: Secondary | ICD-10-CM

## 2024-03-13 DIAGNOSIS — E119 Type 2 diabetes mellitus without complications: Secondary | ICD-10-CM

## 2024-03-13 LAB — COMPREHENSIVE METABOLIC PANEL WITH GFR
ALT: 11 U/L (ref 3–35)
AST: 11 U/L (ref 5–37)
Albumin: 4.3 g/dL (ref 3.5–5.2)
Alkaline Phosphatase: 55 U/L (ref 39–117)
BUN: 5 mg/dL — ABNORMAL LOW (ref 6–23)
CO2: 26 meq/L (ref 19–32)
Calcium: 8.9 mg/dL (ref 8.4–10.5)
Chloride: 107 meq/L (ref 96–112)
Creatinine, Ser: 0.48 mg/dL (ref 0.40–1.20)
GFR: 112.66 mL/min
Glucose, Bld: 99 mg/dL (ref 70–99)
Potassium: 3.6 meq/L (ref 3.5–5.1)
Sodium: 140 meq/L (ref 135–145)
Total Bilirubin: 0.5 mg/dL (ref 0.2–1.2)
Total Protein: 7.4 g/dL (ref 6.0–8.3)

## 2024-03-13 LAB — LIPID PANEL
Cholesterol: 126 mg/dL (ref 28–200)
HDL: 58.1 mg/dL
LDL Cholesterol: 58 mg/dL (ref 10–99)
NonHDL: 68.02
Total CHOL/HDL Ratio: 2
Triglycerides: 50 mg/dL (ref 10.0–149.0)
VLDL: 10 mg/dL (ref 0.0–40.0)

## 2024-03-13 LAB — HEMOGLOBIN A1C: Hgb A1c MFr Bld: 5.6 % (ref 4.6–6.5)

## 2024-03-13 MED ORDER — METFORMIN HCL ER 500 MG PO TB24: 500.0000 mg | ORAL_TABLET | Freq: Every day | ORAL | 1 refills | Status: AC

## 2024-03-13 MED ORDER — ACCU-CHEK GUIDE TEST VI STRP: ORAL_STRIP | 12 refills | Status: AC

## 2024-03-13 MED ORDER — ROSUVASTATIN CALCIUM 5 MG PO TABS: 5.0000 mg | ORAL_TABLET | Freq: Every day | ORAL | 1 refills | Status: AC

## 2024-03-13 NOTE — Assessment & Plan Note (Addendum)
Continue metformin XR 500 mg daily.

## 2024-03-13 NOTE — Progress Notes (Signed)
 "   Established Patient Office Visit Subjective:    Patient ID: Martha Valencia, female    DOB: 03-26-1975, 49 y.o.   MRN: [id number]  Chief Complaint Chief Complaint  Patient presents with   Diabetes    Follow up, Rx refills, no concerns     HPI Martha Valencia is a 49 y.o. female who presents for diabetes management.  Discussed the use of AI scribe software for clinical note transcription with the patient, who gave verbal consent to proceed.  Her blood sugars have been stable since her last visit, with maximum readings around 150 mg/dL and frequent readings near 90-105 mg/dL. She has not had hypoglycemic symptoms since stopping glipizide . She monitors daily with an AccuChex Guide meter and needs more test strips.  She denies chest pain, shortness of breath, or numbness and tingling in her feet. She feels well overall.      Review of Systems See pertinent positives and negatives per HPI.     Objective:    BP 116/72 (BP Location: Left Arm, Patient Position: Sitting, Cuff Size: Normal)   Pulse 79   Temp (!) 97.4 F (36.3 C)   Ht 5' 4 (1.626 m)   Wt 185 lb (83.9 kg)   LMP 03/10/2024 (Exact Date)   SpO2 100%   BMI 31.76 kg/m   BP Readings from Last 3 Encounters:  03/13/24 116/72  12/04/23 120/82  09/26/23 122/80   Wt Readings from Last 3 Encounters:  03/13/24 185 lb (83.9 kg)  12/04/23 180 lb 3.2 oz (81.7 kg)  09/26/23 183 lb (83 kg)   Physical Exam Vitals and nursing note reviewed.  Constitutional:      General: She is not in acute distress.    Appearance: Normal appearance.  HENT:     Head: Normocephalic.  Eyes:     Conjunctiva/sclera: Conjunctivae normal.  Cardiovascular:     Rate and Rhythm: Normal rate and regular rhythm.     Pulses: Normal pulses.     Heart sounds: Normal heart sounds.  Pulmonary:     Effort: Pulmonary effort is normal.     Breath sounds: Normal breath sounds.  Musculoskeletal:     Cervical back: Normal range of motion.  Skin:     General: Skin is warm.  Neurological:     General: No focal deficit present.     Mental Status: She is alert and oriented to person, place, and time.  Psychiatric:        Mood and Affect: Mood normal.        Behavior: Behavior normal.        Thought Content: Thought content normal.        Judgment: Judgment normal.    The 10-year ASCVD risk score (Arnett DK, et al., 2019) is: 1.7%      Assessment & Plan:   Problem List Items Addressed This Visit       Endocrine   Diabetes mellitus without complication (HCC) - Primary   Chronic, stable. Her diabetes is well-controlled with blood glucose levels between 90-150 mg/dL and no hypoglycemia after stopping glipizide . Refill test strips. Continue metformin  XR 500mg  daily and focus on nutrition and exercise. Continue rosuvastatin  5mg  daily for CV protection. Referral placed for eye exam. Check CMP, lipid panel, A1c today. Follow-up in 6 months.       Relevant Medications   rosuvastatin  (CRESTOR ) 5 MG tablet   metFORMIN  (GLUCOPHAGE -XR) 500 MG 24 hr tablet   Other Relevant Orders   Comprehensive  metabolic panel with GFR   Lipid panel   Hemoglobin A1c   Ambulatory referral to Ophthalmology     Other   Long term current use of oral hypoglycemic drug   Continue metformin  XR 500mg  daily.        Tinnie DELENA Harada, NP  I,Emily Lagle,acting as a scribe for [employer], NP.,have documented all relevant documentation on the behalf of [name], NP.  I, Tinnie DELENA Harada, NP, have reviewed all documentation for this visit. The documentation on 03/13/2024 for the exam, diagnosis, procedures, and orders are all accurate and complete.  "

## 2024-03-13 NOTE — Assessment & Plan Note (Signed)
 Chronic, stable. Her diabetes is well-controlled with blood glucose levels between 90-150 mg/dL and no hypoglycemia after stopping glipizide . Refill test strips. Continue metformin  XR 500mg  daily and focus on nutrition and exercise. Continue rosuvastatin  5mg  daily for CV protection. Referral placed for eye exam. Check CMP, lipid panel, A1c today. Follow-up in 6 months.

## 2024-03-13 NOTE — Patient Instructions (Signed)
It was great to see you!  We are checking your labs today and will let you know the results via mychart/phone.   Keep up the great work!  Let's follow-up in 6 months, sooner if you have concerns.  If a referral was placed today, you will be contacted for an appointment. Please note that routine referrals can sometimes take up to 3-4 weeks to process. Please call our office if you haven't heard anything after this time frame.  Take care,  Mardella Nuckles, NP  

## 2024-03-15 ENCOUNTER — Telehealth: Payer: Self-pay | Admitting: Nurse Practitioner

## 2024-03-15 NOTE — Telephone Encounter (Signed)
 Forgot to add

## 2024-03-15 NOTE — Telephone Encounter (Signed)
 Copied from CRM #[id number]. Topic: Referral - Status >> Mar 15, 2024 10:49 AM Viola F wrote: Reason for CRM: Harland from [facility] called to let Tinnie Harada know that patient insurance is out of network with their office and patient would need to be referred elsewhere

## 2024-03-25 ENCOUNTER — Encounter
